# Patient Record
Sex: Female | Born: 1945 | Race: White | Hispanic: No | Marital: Married | Smoking: Never smoker
Health system: Southern US, Community
[De-identification: ages and names within clinical notes are randomized; demographics above are authoritative.]

## PROBLEM LIST (undated history)

## (undated) DIAGNOSIS — C801 Malignant (primary) neoplasm, unspecified: Secondary | ICD-10-CM

## (undated) DIAGNOSIS — A159 Respiratory tuberculosis unspecified: Secondary | ICD-10-CM

## (undated) DIAGNOSIS — J189 Pneumonia, unspecified organism: Secondary | ICD-10-CM

## (undated) DIAGNOSIS — F419 Anxiety disorder, unspecified: Secondary | ICD-10-CM

## (undated) DIAGNOSIS — K219 Gastro-esophageal reflux disease without esophagitis: Secondary | ICD-10-CM

## (undated) DIAGNOSIS — M1990 Unspecified osteoarthritis, unspecified site: Secondary | ICD-10-CM

## (undated) DIAGNOSIS — Z9889 Other specified postprocedural states: Secondary | ICD-10-CM

## (undated) DIAGNOSIS — G709 Myoneural disorder, unspecified: Secondary | ICD-10-CM

## (undated) DIAGNOSIS — T8859XA Other complications of anesthesia, initial encounter: Secondary | ICD-10-CM

## (undated) DIAGNOSIS — T4145XA Adverse effect of unspecified anesthetic, initial encounter: Secondary | ICD-10-CM

## (undated) DIAGNOSIS — I10 Essential (primary) hypertension: Secondary | ICD-10-CM

## (undated) DIAGNOSIS — M810 Age-related osteoporosis without current pathological fracture: Secondary | ICD-10-CM

## (undated) DIAGNOSIS — R112 Nausea with vomiting, unspecified: Secondary | ICD-10-CM

## (undated) HISTORY — PX: EYE SURGERY: SHX253

## (undated) HISTORY — PX: TONSILLECTOMY: SUR1361

## (undated) HISTORY — DX: Essential (primary) hypertension: I10

## (undated) HISTORY — PX: ABDOMINAL HYSTERECTOMY: SHX81

## (undated) HISTORY — PX: APPENDECTOMY: SHX54

## (undated) HISTORY — DX: Age-related osteoporosis without current pathological fracture: M81.0

## (undated) HISTORY — PX: FRACTURE SURGERY: SHX138

## (undated) HISTORY — DX: Respiratory tuberculosis unspecified: A15.9

## (undated) HISTORY — PX: JOINT REPLACEMENT: SHX530

---
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
## 1999-04-07 ENCOUNTER — Ambulatory Visit (HOSPITAL_BASED_OUTPATIENT_CLINIC_OR_DEPARTMENT_OTHER): Admission: RE | Admit: 1999-04-07 | Discharge: 1999-04-07 | Payer: Self-pay | Admitting: Orthopedic Surgery

## 1999-04-18 ENCOUNTER — Ambulatory Visit (HOSPITAL_BASED_OUTPATIENT_CLINIC_OR_DEPARTMENT_OTHER): Admission: RE | Admit: 1999-04-18 | Discharge: 1999-04-18 | Payer: Self-pay | Admitting: Orthopedic Surgery

## 2001-11-07 ENCOUNTER — Ambulatory Visit (HOSPITAL_BASED_OUTPATIENT_CLINIC_OR_DEPARTMENT_OTHER): Admission: RE | Admit: 2001-11-07 | Discharge: 2001-11-07 | Payer: Self-pay | Admitting: Surgery

## 2002-01-23 ENCOUNTER — Encounter: Admission: RE | Admit: 2002-01-23 | Discharge: 2002-01-23 | Payer: Self-pay | Admitting: Otolaryngology

## 2002-01-23 ENCOUNTER — Encounter: Payer: Self-pay | Admitting: Otolaryngology

## 2011-07-31 ENCOUNTER — Ambulatory Visit (INDEPENDENT_AMBULATORY_CARE_PROVIDER_SITE_OTHER): Payer: Medicare Other

## 2011-07-31 DIAGNOSIS — J019 Acute sinusitis, unspecified: Secondary | ICD-10-CM

## 2011-07-31 DIAGNOSIS — R05 Cough: Secondary | ICD-10-CM

## 2011-07-31 DIAGNOSIS — R059 Cough, unspecified: Secondary | ICD-10-CM

## 2011-07-31 DIAGNOSIS — J029 Acute pharyngitis, unspecified: Secondary | ICD-10-CM

## 2011-07-31 DIAGNOSIS — R0602 Shortness of breath: Secondary | ICD-10-CM

## 2011-08-20 ENCOUNTER — Ambulatory Visit (INDEPENDENT_AMBULATORY_CARE_PROVIDER_SITE_OTHER): Payer: Medicare Other

## 2011-08-20 DIAGNOSIS — R599 Enlarged lymph nodes, unspecified: Secondary | ICD-10-CM

## 2011-08-20 DIAGNOSIS — J019 Acute sinusitis, unspecified: Secondary | ICD-10-CM

## 2011-08-20 DIAGNOSIS — J029 Acute pharyngitis, unspecified: Secondary | ICD-10-CM

## 2011-10-27 ENCOUNTER — Ambulatory Visit (INDEPENDENT_AMBULATORY_CARE_PROVIDER_SITE_OTHER): Payer: Medicare Other | Admitting: Internal Medicine

## 2011-10-27 VITALS — BP 134/84 | HR 66 | Temp 97.8°F | Resp 16 | Ht 65.0 in | Wt 198.0 lb

## 2011-10-27 DIAGNOSIS — I10 Essential (primary) hypertension: Secondary | ICD-10-CM

## 2011-10-27 DIAGNOSIS — H9209 Otalgia, unspecified ear: Secondary | ICD-10-CM

## 2011-10-27 DIAGNOSIS — R51 Headache: Secondary | ICD-10-CM

## 2011-10-27 DIAGNOSIS — G6181 Chronic inflammatory demyelinating polyneuritis: Secondary | ICD-10-CM

## 2011-10-27 DIAGNOSIS — G518 Other disorders of facial nerve: Secondary | ICD-10-CM

## 2011-10-27 MED ORDER — VALACYCLOVIR HCL 1 G PO TABS: 1000.0000 mg | ORAL_TABLET | Freq: Three times a day (TID) | ORAL | Status: AC

## 2011-10-27 MED ORDER — LISINOPRIL-HYDROCHLOROTHIAZIDE 10-12.5 MG PO TABS: 1.0000 | ORAL_TABLET | Freq: Every day | ORAL | Status: DC

## 2011-10-27 NOTE — Patient Instructions (Signed)

## 2011-10-27 NOTE — Progress Notes (Signed)
  Subjective:    Patient ID: Kelli Swanson, female    DOB: 07-22-1946, 66 y.o.   MRN: [id number]  HPI Has pain left ear and face abrupt onset.  Review of Systems     Objective:   Physical Exam Normal    No rash seen.      Assessment & Plan:  Valtex 1g TID for possible shingles Motrin 600mg  prn pain Watch for rash

## 2012-02-27 ENCOUNTER — Ambulatory Visit: Payer: Medicare Other

## 2012-02-27 ENCOUNTER — Ambulatory Visit (INDEPENDENT_AMBULATORY_CARE_PROVIDER_SITE_OTHER): Payer: Medicare Other | Admitting: Family Medicine

## 2012-02-27 VITALS — BP 130/82 | HR 80 | Temp 98.7°F | Resp 16 | Ht 65.0 in | Wt 210.4 lb

## 2012-02-27 DIAGNOSIS — M7989 Other specified soft tissue disorders: Secondary | ICD-10-CM

## 2012-02-27 DIAGNOSIS — M25561 Pain in right knee: Secondary | ICD-10-CM

## 2012-02-27 DIAGNOSIS — M25569 Pain in unspecified knee: Secondary | ICD-10-CM

## 2012-02-27 DIAGNOSIS — M25559 Pain in unspecified hip: Secondary | ICD-10-CM

## 2012-02-27 MED ORDER — NABUMETONE 750 MG PO TABS: 750.0000 mg | ORAL_TABLET | Freq: Two times a day (BID) | ORAL | Status: AC
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
## 2012-02-27 NOTE — Patient Instructions (Signed)
Venous Doppler will be scheduled for the right leg.  Referral will be made to Dr. Wyline Mood.  Return if acutely worse.  Take Relafen twice daily.

## 2012-02-28 NOTE — Addendum Note (Signed)
Addended by: Carollee Leitz L on: 02/28/2012 10:50 AM   Modules accepted: Orders

## 2012-02-29 ENCOUNTER — Ambulatory Visit (HOSPITAL_COMMUNITY)
Admission: RE | Admit: 2012-02-29 | Discharge: 2012-02-29 | Disposition: A | Discharge status: Home / Self Care | Payer: Medicare Other | Source: Ambulatory Visit | Attending: Family Medicine | Admitting: Family Medicine

## 2012-02-29 DIAGNOSIS — M25561 Pain in right knee: Secondary | ICD-10-CM

## 2012-02-29 DIAGNOSIS — M79609 Pain in unspecified limb: Secondary | ICD-10-CM | POA: Insufficient documentation

## 2012-02-29 DIAGNOSIS — M7989 Other specified soft tissue disorders: Secondary | ICD-10-CM

## 2012-02-29 DIAGNOSIS — M25569 Pain in unspecified knee: Secondary | ICD-10-CM | POA: Insufficient documentation

## 2012-02-29 NOTE — Progress Notes (Signed)
*  Preliminary Results* Right lower extremity venous duplex completed. Right lower extremity is negative for deep vein thrombosis.  02/29/2012 10:40 AM Gertie Fey, RDMS, RDCS

## 2012-02-29 NOTE — Research (Signed)
[facility] clinical research study discussed with patient.  All questions and concerns were addressed and answered previous to obtaining informed consent.  No study procedures were initiated prior to obtaining informed consent.  A signed copy of the consent was provided to the patient.  Please contact study coordinator or Dr. Chancy Milroy for any questions or concerns.

## 2012-09-09 ENCOUNTER — Encounter: Payer: Self-pay | Admitting: Family Medicine

## 2012-11-05 HISTORY — PX: KNEE SURGERY: SHX244

## 2012-11-14 ENCOUNTER — Encounter: Payer: Self-pay | Admitting: Emergency Medicine

## 2012-12-17 ENCOUNTER — Ambulatory Visit (INDEPENDENT_AMBULATORY_CARE_PROVIDER_SITE_OTHER): Payer: Medicare Other | Admitting: Emergency Medicine

## 2012-12-17 VITALS — BP 154/84 | HR 75 | Temp 98.5°F | Resp 16 | Ht 64.5 in | Wt 224.0 lb

## 2012-12-17 DIAGNOSIS — G4700 Insomnia, unspecified: Secondary | ICD-10-CM

## 2012-12-17 DIAGNOSIS — I10 Essential (primary) hypertension: Secondary | ICD-10-CM

## 2012-12-17 DIAGNOSIS — E785 Hyperlipidemia, unspecified: Secondary | ICD-10-CM

## 2012-12-17 LAB — POCT CBC
Granulocyte percent: 67.1 %G (ref 37–80)
Hemoglobin: 13.9 g/dL (ref 12.2–16.2)
MCH, POC: 29.6 pg (ref 27–31.2)
MCV: 94.6 fL (ref 80–97)
MPV: 10.8 fL (ref 0–99.8)
RBC: 4.69 M/uL (ref 4.04–5.48)
WBC: 5.6 10*3/uL (ref 4.6–10.2)

## 2012-12-17 LAB — BASIC METABOLIC PANEL
BUN: 17 mg/dL (ref 6–23)
CO2: 28 mEq/L (ref 19–32)
Chloride: 103 mEq/L (ref 96–112)
Creat: 1.01 mg/dL (ref 0.50–1.10)
Potassium: 4.3 mEq/L (ref 3.5–5.3)

## 2012-12-17 LAB — LIPID PANEL
HDL: 68 mg/dL (ref >39)
LDL Cholesterol: 121 mg/dL — ABNORMAL HIGH (ref 0–99)
Triglycerides: 124 mg/dL (ref <150)
VLDL: 25 mg/dL (ref 0–40)

## 2012-12-17 MED ORDER — TRAMADOL HCL 50 MG PO TABS: 50.0000 mg | ORAL_TABLET | Freq: Four times a day (QID) | ORAL | Status: DC | PRN

## 2012-12-17 MED ORDER — LISINOPRIL-HYDROCHLOROTHIAZIDE 10-12.5 MG PO TABS: 2.0000 | ORAL_TABLET | Freq: Every day | ORAL | Status: DC

## 2012-12-17 NOTE — Progress Notes (Signed)
  Subjective:    Patient ID: Kelli Swanson, female    DOB: 1946-06-21, 67 y.o.   MRN: [id number]  HPI  67 year old female presents for a refill on bp med.  Has put on weight due to knee surgery.  She thinks the inabitility to move much is why her bp is up.  She has stated riding the stationary bike.  Ankle is swollen.  And knee is swollen.  Was on relafin but was taken off of it due to making her feel bad.  Does not sleep good due to pain.    Had knee surgery on March 26.  Dr Salvatore Marvel did the surgery.      Review of Systems     Objective:   Physical Exam HEENT exam is unremarkable neck supple chest clear heart regular rate no murmurs.  Results for orders placed in visit on 12/17/12  POCT CBC      Result Value Range   WBC 5.6  4.6 - 10.2 K/uL   Lymph, poc 1.3  0.6 - 3.4   POC LYMPH PERCENT 24.1  10 - 50 %L   MID (cbc) 0.5  0 - 0.9   POC MID % 8.8  0 - 12 %M   POC Granulocyte 3.8  2 - 6.9   Granulocyte percent 67.1  37 - 80 %G   RBC 4.69  4.04 - 5.48 M/uL   Hemoglobin 13.9  12.2 - 16.2 g/dL   HCT, POC 40.9  81.1 - 47.9 %   MCV 94.6  80 - 97 fL   MCH, POC 29.6  27 - 31.2 pg   MCHC 31.3 (*) 31.8 - 35.4 g/dL   RDW, POC 91.4     Platelet Count, POC 229  142 - 424 K/uL   MPV 10.8  0 - 99.8 fL        Assessment & Plan:  Blood pressure medicine was increased to 2 tablets a day to get better blood pressure control followup in 4-6 weeks.

## 2013-02-01 ENCOUNTER — Ambulatory Visit (INDEPENDENT_AMBULATORY_CARE_PROVIDER_SITE_OTHER): Payer: Medicare Other | Admitting: Emergency Medicine

## 2013-02-01 VITALS — BP 146/80 | HR 112 | Temp 98.1°F | Resp 18 | Ht 64.5 in | Wt 212.2 lb

## 2013-02-01 DIAGNOSIS — J018 Other acute sinusitis: Secondary | ICD-10-CM

## 2013-02-01 MED ORDER — PSEUDOEPHEDRINE-GUAIFENESIN ER 60-600 MG PO TB12: 1.0000 | ORAL_TABLET | Freq: Two times a day (BID) | ORAL | Status: DC

## 2013-02-01 MED ORDER — LEVOFLOXACIN 500 MG PO TABS: 500.0000 mg | ORAL_TABLET | Freq: Every day | ORAL | Status: DC

## 2013-02-01 NOTE — Patient Instructions (Addendum)

## 2013-02-01 NOTE — Progress Notes (Signed)
[facility] [address], [place] [address] [phone]  Date:  02/01/2013   Name:  Kelli Swanson   DOB:  Sep 17, 1945   MRN:  [id number]  PCP:  Tally Due, MD    Chief Complaint: Sinusitis and Headache   History of Present Illness:  Kelli Swanson is a 67 y.o. very pleasant female patient who presents with the following:  Ill for a week with nasal congestion and post nasal drainage. Has some chills and night sweats.  No documented fever or chills. No nausea or vomiting. No cough wheezing or shortness of breath.  Pressure in cheeks and forehead is now severe and interfering with sleep. Marked headache.  No neuro or visual symptoms.   No improvement with over the counter medications or other home remedies. Denies other complaint or health concern today.   Patient Active Problem List   Diagnosis Date Noted  . HTN (hypertension) 10/27/2011    Past Medical History  Diagnosis Date  . Hypertension   . Osteoporosis     Past Surgical History  Procedure Laterality Date  . Abdominal hysterectomy    . Appendectomy    . Fracture surgery    . Knee surgery Right 11/05/2012    History  Substance Use Topics  . Smoking status: Never Smoker   . Smokeless tobacco: Not on file  . Alcohol Use: Not on file    History reviewed. No pertinent family history.  Allergies  Allergen Reactions  . Penicillins Anaphylaxis  . Codeine Itching    Medication list has been reviewed and updated.  Current Outpatient Prescriptions on File Prior to Visit  Medication Sig Dispense Refill  . calcium-vitamin D (OSCAL WITH D) 500-200 MG-UNIT per tablet Take 1 tablet by mouth daily.      Marland Kitchen denosumab (PROLIA) 60 MG/ML SOLN injection Inject 60 mg into the skin every 6 (six) months. Administer in upper arm, thigh, or abdomen      . ibuprofen (ADVIL,MOTRIN) 200 MG tablet Take 200 mg by mouth every 6 (six) hours as needed for pain.      Marland Kitchen lisinopril-hydrochlorothiazide  (PRINZIDE,ZESTORETIC) 10-12.5 MG per tablet Take 2 tablets by mouth daily.  180 tablet  3  . Multiple Vitamins-Minerals (CENTRUM SILVER) tablet Take 1 tablet by mouth daily.      . traMADol (ULTRAM) 50 MG tablet Take 1 tablet (50 mg total) by mouth every 6 (six) hours as needed for pain.  30 tablet  1  . nabumetone (RELAFEN) 750 MG tablet Take 1 tablet (750 mg total) by mouth 2 (two) times daily.  30 tablet  1  . tobramycin-dexamethasone (TOBRADEX) ophthalmic solution 1 drop 3 (three) times daily.       No current facility-administered medications on file prior to visit.    Review of Systems:  As per HPI, otherwise negative.    Physical Examination: Filed Vitals:   02/01/13 1408  BP: 146/80  Pulse: 112  Temp: 98.1 F (36.7 C)  Resp: 18   Filed Vitals:   02/01/13 1408  Height: 5' 4.5" (1.638 m)  Weight: 212 lb 3.2 oz (96.253 kg)   Body mass index is 35.87 kg/(m^2). Ideal Body Weight: Weight in (lb) to have BMI = 25: 147.6  GEN: WDWN, moderate distress. Non-toxic, A & O x 3 HEENT: Atraumatic, Normocephalic. Neck supple. No masses, No LAD.  Tender over maxillary and frontal sinuses Ears and Nose: No external deformity. CV: RRR, No M/G/R. No JVD. No  thrill. No extra heart sounds. PULM: CTA B, no wheezes, crackles, rhonchi. No retractions. No resp. distress. No accessory muscle use. ABD: S, NT, ND, +BS. No rebound. No HSM. EXTR: No c/c/e NEURO Normal gait.  PSYCH: Normally interactive. Conversant. Not depressed or anxious appearing.  Calm demeanor.    Assessment and Plan: Sinusitis augmentin mucinex d   Signed,  Phillips Odor, MD

## 2013-02-05 ENCOUNTER — Telehealth: Payer: Self-pay

## 2013-02-05 NOTE — Telephone Encounter (Signed)
Patient called requesting prescription for the Levaquin be changed to something else due to leg pains. Please let pt know if this can be done  [phone] Pharmacy- [address]

## 2013-02-06 MED ORDER — DOXYCYCLINE HYCLATE 100 MG PO CAPS: 100.0000 mg | ORAL_CAPSULE | Freq: Two times a day (BID) | ORAL | Status: DC

## 2013-02-06 NOTE — Telephone Encounter (Signed)
Rx changed to doxycycline and was sent to pharmacy

## 2013-02-06 NOTE — Telephone Encounter (Signed)
Patient advised.

## 2013-03-09 ENCOUNTER — Encounter (HOSPITAL_COMMUNITY): Payer: Self-pay | Admitting: Pharmacy Technician

## 2013-03-11 ENCOUNTER — Other Ambulatory Visit: Payer: Self-pay | Admitting: Orthopedic Surgery

## 2013-03-12 NOTE — Pre-Procedure Instructions (Addendum)
Kelli Swanson  03/12/2013   Your procedure is scheduled on:  03/23/2013  Report to [facility] at 7:15 AM.  Call this number if you have problems the morning of surgery: [phone]   Remember:   Do not eat food or drink liquids after midnight. On Sunday   Take these medicines the morning of surgery with A SIP OF WATER: NONE   Do not wear jewelry, make-up or nail polish.  Do not wear lotions, powders, or perfumes. You may wear deodorant.  Do not shave 48 hours prior to surgery.   Do not bring valuables to the hospital.  [facility] is not responsible                   for any belongings or valuables.  Contacts, dentures or bridgework may not be worn into surgery.  Leave suitcase in the car. After surgery it may be brought to your room.  For patients admitted to the hospital, checkout time is 11:00 AM the day of  discharge.   Patients discharged the day of surgery will not be allowed to drive  home.  Name and phone number of your driver:   Special Instructions: Shower using CHG 2 nights before surgery and the night before surgery.  If you shower the day of surgery use CHG.  Use special wash - you have one bottle of CHG for all showers.  You should use approximately 1/3 of the bottle for each shower.   Please read over the following fact sheets that you were given: Pain Booklet, Coughing and Deep Breathing, Blood Transfusion Information, Total Joint Packet, MRSA Information and Surgical Site Infection Prevention

## 2013-03-13 ENCOUNTER — Ambulatory Visit (HOSPITAL_COMMUNITY)
Admission: RE | Admit: 2013-03-13 | Discharge: 2013-03-13 | Disposition: A | Discharge status: Home / Self Care | Payer: Medicare Other | Source: Ambulatory Visit | Attending: Orthopedic Surgery | Admitting: Orthopedic Surgery

## 2013-03-13 ENCOUNTER — Encounter (HOSPITAL_COMMUNITY): Payer: Self-pay

## 2013-03-13 ENCOUNTER — Encounter (HOSPITAL_COMMUNITY)
Admission: RE | Admit: 2013-03-13 | Discharge: 2013-03-13 | Disposition: A | Discharge status: Home / Self Care | Payer: Medicare Other | Source: Ambulatory Visit | Attending: Orthopedic Surgery | Admitting: Orthopedic Surgery

## 2013-03-13 DIAGNOSIS — Z01818 Encounter for other preprocedural examination: Secondary | ICD-10-CM | POA: Insufficient documentation

## 2013-03-13 DIAGNOSIS — I10 Essential (primary) hypertension: Secondary | ICD-10-CM | POA: Insufficient documentation

## 2013-03-13 DIAGNOSIS — Z01812 Encounter for preprocedural laboratory examination: Secondary | ICD-10-CM | POA: Insufficient documentation

## 2013-03-13 DIAGNOSIS — Z01810 Encounter for preprocedural cardiovascular examination: Secondary | ICD-10-CM | POA: Insufficient documentation

## 2013-03-13 DIAGNOSIS — M948X9 Other specified disorders of cartilage, unspecified sites: Secondary | ICD-10-CM | POA: Insufficient documentation

## 2013-03-13 HISTORY — DX: Gastro-esophageal reflux disease without esophagitis: K21.9

## 2013-03-13 HISTORY — DX: Other complications of anesthesia, initial encounter: T88.59XA

## 2013-03-13 HISTORY — DX: Other specified postprocedural states: Z98.890

## 2013-03-13 HISTORY — DX: Pneumonia, unspecified organism: J18.9

## 2013-03-13 HISTORY — DX: Malignant (primary) neoplasm, unspecified: C80.1

## 2013-03-13 HISTORY — DX: Unspecified osteoarthritis, unspecified site: M19.90

## 2013-03-13 HISTORY — DX: Myoneural disorder, unspecified: G70.9

## 2013-03-13 HISTORY — DX: Nausea with vomiting, unspecified: R11.2

## 2013-03-13 HISTORY — DX: Anxiety disorder, unspecified: F41.9

## 2013-03-13 HISTORY — DX: Adverse effect of unspecified anesthetic, initial encounter: T41.45XA

## 2013-03-13 LAB — URINALYSIS, ROUTINE W REFLEX MICROSCOPIC
Bilirubin Urine: NEGATIVE
Glucose, UA: NEGATIVE mg/dL
Hgb urine dipstick: NEGATIVE
Ketones, ur: NEGATIVE mg/dL
Leukocytes, UA: NEGATIVE
Nitrite: NEGATIVE
Protein, ur: NEGATIVE mg/dL
Specific Gravity, Urine: 1.016 (ref 1.005–1.030)
Urobilinogen, UA: 0.2 mg/dL (ref 0.0–1.0)
pH: 6 (ref 5.0–8.0)

## 2013-03-13 LAB — CBC WITH DIFFERENTIAL/PLATELET
Basophils Relative: 1 % (ref 0–1)
Eosinophils Absolute: 0.1 10*3/uL (ref 0.0–0.7)
HCT: 43.5 % (ref 36.0–46.0)
Hemoglobin: 14.8 g/dL (ref 12.0–15.0)
MCH: 31 pg (ref 26.0–34.0)
MCHC: 34 g/dL (ref 30.0–36.0)
Monocytes Absolute: 0.6 10*3/uL (ref 0.1–1.0)
Monocytes Relative: 8 % (ref 3–12)

## 2013-03-13 LAB — BASIC METABOLIC PANEL
BUN: 24 mg/dL — ABNORMAL HIGH (ref 6–23)
CO2: 27 mEq/L (ref 19–32)
Chloride: 101 mEq/L (ref 96–112)
Glucose, Bld: 107 mg/dL — ABNORMAL HIGH (ref 70–99)
Potassium: 3.9 mEq/L (ref 3.5–5.1)

## 2013-03-13 LAB — ABO/RH: ABO/RH(D): B NEG

## 2013-03-13 NOTE — Progress Notes (Signed)
Seen at [facility] , had EKG 1 + yrs. Ago, CXR done around the same time for URI
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
## 2013-03-22 DIAGNOSIS — M1711 Unilateral primary osteoarthritis, right knee: Secondary | ICD-10-CM | POA: Diagnosis present

## 2013-03-22 MED ORDER — VANCOMYCIN HCL IN DEXTROSE 1-5 GM/200ML-% IV SOLN
1000.0000 mg | INTRAVENOUS | Status: AC
  Administered 2013-03-23: 1000 mg via INTRAVENOUS
  Filled 2013-03-22: qty 200

## 2013-03-22 MED ORDER — CHLORHEXIDINE GLUCONATE 4 % EX LIQD: 60.0000 mL | Freq: Once | CUTANEOUS | Status: DC

## 2013-03-23 ENCOUNTER — Inpatient Hospital Stay (HOSPITAL_COMMUNITY)
Admission: RE | Admit: 2013-03-23 | Discharge: 2013-03-27 | DRG: 470 | Disposition: A | Discharge status: Home / Self Care | Payer: Medicare Other | Source: Ambulatory Visit | Attending: Orthopedic Surgery | Admitting: Orthopedic Surgery

## 2013-03-23 ENCOUNTER — Encounter (HOSPITAL_COMMUNITY): Payer: Self-pay | Admitting: Anesthesiology

## 2013-03-23 ENCOUNTER — Encounter (HOSPITAL_COMMUNITY)
Admission: RE | Disposition: A | Discharge status: Home / Self Care | Payer: Self-pay | Source: Ambulatory Visit | Attending: Orthopedic Surgery

## 2013-03-23 ENCOUNTER — Inpatient Hospital Stay (HOSPITAL_COMMUNITY): Payer: Medicare Other | Admitting: Anesthesiology

## 2013-03-23 DIAGNOSIS — D649 Anemia, unspecified: Secondary | ICD-10-CM | POA: Diagnosis not present

## 2013-03-23 DIAGNOSIS — I959 Hypotension, unspecified: Secondary | ICD-10-CM

## 2013-03-23 DIAGNOSIS — I9589 Other hypotension: Secondary | ICD-10-CM | POA: Diagnosis not present

## 2013-03-23 DIAGNOSIS — F411 Generalized anxiety disorder: Secondary | ICD-10-CM | POA: Diagnosis present

## 2013-03-23 DIAGNOSIS — M1710 Unilateral primary osteoarthritis, unspecified knee: Secondary | ICD-10-CM | POA: Diagnosis present

## 2013-03-23 DIAGNOSIS — E861 Hypovolemia: Secondary | ICD-10-CM | POA: Diagnosis not present

## 2013-03-23 DIAGNOSIS — N179 Acute kidney failure, unspecified: Secondary | ICD-10-CM

## 2013-03-23 DIAGNOSIS — Z85828 Personal history of other malignant neoplasm of skin: Secondary | ICD-10-CM

## 2013-03-23 DIAGNOSIS — M12569 Traumatic arthropathy, unspecified knee: Principal | ICD-10-CM | POA: Diagnosis present

## 2013-03-23 DIAGNOSIS — M1711 Unilateral primary osteoarthritis, right knee: Secondary | ICD-10-CM

## 2013-03-23 DIAGNOSIS — I10 Essential (primary) hypertension: Secondary | ICD-10-CM | POA: Diagnosis present

## 2013-03-23 DIAGNOSIS — M810 Age-related osteoporosis without current pathological fracture: Secondary | ICD-10-CM | POA: Diagnosis present

## 2013-03-23 DIAGNOSIS — K219 Gastro-esophageal reflux disease without esophagitis: Secondary | ICD-10-CM | POA: Diagnosis present

## 2013-03-23 DIAGNOSIS — IMO0002 Reserved for concepts with insufficient information to code with codable children: Secondary | ICD-10-CM | POA: Diagnosis present

## 2013-03-23 DIAGNOSIS — F40298 Other specified phobia: Secondary | ICD-10-CM | POA: Diagnosis present

## 2013-03-23 HISTORY — PX: TOTAL KNEE ARTHROPLASTY: SHX125

## 2013-03-23 HISTORY — PX: TOTAL KNEE ARTHROPLASTY WITH HARDWARE REMOVAL: SHX6437

## 2013-03-23 SURGERY — REVISION, TOTAL ARTHROPLASTY, KNEE, WITH ARTICULAR COMPONENT REPLACEMENT
Anesthesia: General | Site: Knee | Laterality: Right | Wound class: Clean

## 2013-03-23 MED ORDER — SODIUM CHLORIDE 0.9 % IV SOLN
1000.0000 mg | INTRAVENOUS | Status: AC
  Administered 2013-03-23: 1000 mg via INTRAVENOUS
  Filled 2013-03-23: qty 10

## 2013-03-23 MED ORDER — ONDANSETRON HCL 4 MG/2ML IJ SOLN
INTRAMUSCULAR | Status: DC | PRN
  Administered 2013-03-23: 4 mg via INTRAVENOUS

## 2013-03-23 MED ORDER — NEOSTIGMINE METHYLSULFATE 1 MG/ML IJ SOLN
INTRAMUSCULAR | Status: DC | PRN
  Administered 2013-03-23: 4 mg via INTRAVENOUS

## 2013-03-23 MED ORDER — MENTHOL 3 MG MT LOZG: 1.0000 | LOZENGE | OROMUCOSAL | Status: DC | PRN

## 2013-03-23 MED ORDER — FENTANYL CITRATE 0.05 MG/ML IJ SOLN
INTRAMUSCULAR | Status: DC | PRN
  Administered 2013-03-23: 50 ug via INTRAVENOUS
  Administered 2013-03-23: 100 ug via INTRAVENOUS
  Administered 2013-03-23: 50 ug via INTRAVENOUS
  Administered 2013-03-23: 100 ug via INTRAVENOUS

## 2013-03-23 MED ORDER — ONDANSETRON HCL 4 MG PO TABS: 4.0000 mg | ORAL_TABLET | Freq: Four times a day (QID) | ORAL | Status: DC | PRN

## 2013-03-23 MED ORDER — CEFUROXIME SODIUM 1.5 G IJ SOLR
INTRAMUSCULAR | Status: DC | PRN
  Administered 2013-03-23: 1.5 g

## 2013-03-23 MED ORDER — PHENYLEPHRINE HCL 10 MG/ML IJ SOLN
INTRAMUSCULAR | Status: DC | PRN
  Administered 2013-03-23 (×2): 80 ug via INTRAVENOUS

## 2013-03-23 MED ORDER — FENTANYL CITRATE 0.05 MG/ML IJ SOLN
25.0000 ug | INTRAMUSCULAR | Status: DC | PRN
Start: 2013-03-23 — End: 2013-03-23
  Administered 2013-03-23 (×2): 50 ug via INTRAVENOUS

## 2013-03-23 MED ORDER — ACETAMINOPHEN 325 MG PO TABS
650.0000 mg | ORAL_TABLET | Freq: Four times a day (QID) | ORAL | Status: DC | PRN
  Administered 2013-03-23: 650 mg via ORAL
  Filled 2013-03-23: qty 2

## 2013-03-23 MED ORDER — LIDOCAINE HCL (CARDIAC) 20 MG/ML IV SOLN
INTRAVENOUS | Status: DC | PRN
  Administered 2013-03-23: 50 mg via INTRAVENOUS

## 2013-03-23 MED ORDER — 0.9 % SODIUM CHLORIDE (POUR BTL) OPTIME
TOPICAL | Status: DC | PRN
  Administered 2013-03-23: 1000 mL

## 2013-03-23 MED ORDER — METOCLOPRAMIDE HCL 5 MG/ML IJ SOLN: 5.0000 mg | Freq: Three times a day (TID) | INTRAMUSCULAR | Status: DC | PRN

## 2013-03-23 MED ORDER — LISINOPRIL-HYDROCHLOROTHIAZIDE 10-12.5 MG PO TABS: 2.0000 | ORAL_TABLET | Freq: Every day | ORAL | Status: DC

## 2013-03-23 MED ORDER — SENNOSIDES-DOCUSATE SODIUM 8.6-50 MG PO TABS
1.0000 | ORAL_TABLET | Freq: Every evening | ORAL | Status: DC | PRN
  Administered 2013-03-26: 1 via ORAL
  Filled 2013-03-23: qty 1

## 2013-03-23 MED ORDER — DEXTROSE-NACL 5-0.45 % IV SOLN: INTRAVENOUS | Status: DC

## 2013-03-23 MED ORDER — LACTATED RINGERS IV SOLN
INTRAVENOUS | Status: DC | PRN
  Administered 2013-03-23 (×2): via INTRAVENOUS

## 2013-03-23 MED ORDER — KCL IN DEXTROSE-NACL 20-5-0.45 MEQ/L-%-% IV SOLN
INTRAVENOUS | Status: AC
  Filled 2013-03-23: qty 1000

## 2013-03-23 MED ORDER — BUPIVACAINE LIPOSOME 1.3 % IJ SUSP
INTRAMUSCULAR | Status: DC | PRN
  Administered 2013-03-23: 20 mL

## 2013-03-23 MED ORDER — MAGNESIUM CITRATE PO SOLN
1.0000 | Freq: Once | ORAL | Status: AC | PRN
  Filled 2013-03-23: qty 296

## 2013-03-23 MED ORDER — ARTIFICIAL TEARS OP OINT
TOPICAL_OINTMENT | OPHTHALMIC | Status: DC | PRN
  Administered 2013-03-23: 1 via OPHTHALMIC

## 2013-03-23 MED ORDER — ACETAMINOPHEN 325 MG PO TABS
ORAL_TABLET | ORAL | Status: AC
  Administered 2013-03-23: 650 mg via ORAL
  Filled 2013-03-23: qty 2

## 2013-03-23 MED ORDER — MORPHINE SULFATE 10 MG/ML IJ SOLN
INTRAMUSCULAR | Status: DC | PRN
  Administered 2013-03-23: 3 mg via INTRAVENOUS
  Administered 2013-03-23: 2 mg via INTRAVENOUS
  Administered 2013-03-23: 3 mg via INTRAVENOUS
  Administered 2013-03-23: 2 mg via INTRAVENOUS

## 2013-03-23 MED ORDER — OXYCODONE HCL 5 MG/5ML PO SOLN
5.0000 mg | Freq: Once | ORAL | Status: AC | PRN
Start: 2013-03-23 — End: 2013-03-23

## 2013-03-23 MED ORDER — PROPOFOL 10 MG/ML IV BOLUS
INTRAVENOUS | Status: DC | PRN
  Administered 2013-03-23: 150 mg via INTRAVENOUS

## 2013-03-23 MED ORDER — FENTANYL CITRATE 0.05 MG/ML IJ SOLN
INTRAMUSCULAR | Status: AC
  Administered 2013-03-23: 50 ug via INTRAVENOUS
  Filled 2013-03-23: qty 2

## 2013-03-23 MED ORDER — ROCURONIUM BROMIDE 100 MG/10ML IV SOLN
INTRAVENOUS | Status: DC | PRN
  Administered 2013-03-23: 50 mg via INTRAVENOUS

## 2013-03-23 MED ORDER — ACETAMINOPHEN 650 MG RE SUPP: 650.0000 mg | Freq: Four times a day (QID) | RECTAL | Status: DC | PRN

## 2013-03-23 MED ORDER — DIPHENHYDRAMINE HCL 25 MG PO TABS: 25.0000 mg | ORAL_TABLET | Freq: Four times a day (QID) | ORAL | Status: DC | PRN

## 2013-03-23 MED ORDER — SODIUM CHLORIDE 0.9 % IR SOLN
Status: DC | PRN
  Administered 2013-03-23: 3000 mL

## 2013-03-23 MED ORDER — SODIUM CHLORIDE 0.9 % IJ SOLN
INTRAMUSCULAR | Status: DC | PRN
  Administered 2013-03-23: 60 mL

## 2013-03-23 MED ORDER — LISINOPRIL 20 MG PO TABS
20.0000 mg | ORAL_TABLET | Freq: Every day | ORAL | Status: DC
  Administered 2013-03-24: 20 mg via ORAL
  Filled 2013-03-23 (×3): qty 1

## 2013-03-23 MED ORDER — KCL IN DEXTROSE-NACL 20-5-0.45 MEQ/L-%-% IV SOLN
INTRAVENOUS | Status: DC
  Administered 2013-03-24: 16:00:00 via INTRAVENOUS
  Filled 2013-03-23 (×14): qty 1000

## 2013-03-23 MED ORDER — TIZANIDINE HCL 2 MG PO TABS
2.0000 mg | ORAL_TABLET | Freq: Three times a day (TID) | ORAL | Status: DC | PRN
  Administered 2013-03-23 – 2013-03-25 (×2): 2 mg via ORAL
  Filled 2013-03-23 (×2): qty 1

## 2013-03-23 MED ORDER — OXYCODONE HCL 5 MG PO TABS: 5.0000 mg | ORAL_TABLET | Freq: Once | ORAL | Status: AC | PRN

## 2013-03-23 MED ORDER — HYDROCHLOROTHIAZIDE 25 MG PO TABS
25.0000 mg | ORAL_TABLET | Freq: Every day | ORAL | Status: DC
  Administered 2013-03-24: 25 mg via ORAL
  Filled 2013-03-23 (×3): qty 1

## 2013-03-23 MED ORDER — DIPHENHYDRAMINE HCL 12.5 MG/5ML PO ELIX
12.5000 mg | ORAL_SOLUTION | ORAL | Status: DC | PRN
  Administered 2013-03-23: 25 mg via ORAL
  Filled 2013-03-23: qty 10

## 2013-03-23 MED ORDER — ONDANSETRON HCL 4 MG/2ML IJ SOLN: 4.0000 mg | Freq: Four times a day (QID) | INTRAMUSCULAR | Status: DC | PRN

## 2013-03-23 MED ORDER — OXYCODONE HCL 5 MG PO TABS
5.0000 mg | ORAL_TABLET | ORAL | Status: DC | PRN
  Administered 2013-03-23 – 2013-03-27 (×12): 10 mg via ORAL
  Filled 2013-03-23 (×13): qty 2

## 2013-03-23 MED ORDER — ASPIRIN EC 325 MG PO TBEC
325.0000 mg | DELAYED_RELEASE_TABLET | Freq: Every day | ORAL | Status: DC
  Administered 2013-03-24 – 2013-03-27 (×4): 325 mg via ORAL
  Filled 2013-03-23 (×6): qty 1

## 2013-03-23 MED ORDER — BISACODYL 5 MG PO TBEC: 5.0000 mg | DELAYED_RELEASE_TABLET | Freq: Every day | ORAL | Status: DC | PRN

## 2013-03-23 MED ORDER — DOCUSATE SODIUM 100 MG PO CAPS
100.0000 mg | ORAL_CAPSULE | Freq: Two times a day (BID) | ORAL | Status: DC
  Administered 2013-03-23 – 2013-03-27 (×8): 100 mg via ORAL
  Filled 2013-03-23 (×9): qty 1

## 2013-03-23 MED ORDER — BUPIVACAINE LIPOSOME 1.3 % IJ SUSP
20.0000 mL | Freq: Once | INTRAMUSCULAR | Status: DC
  Filled 2013-03-23: qty 20

## 2013-03-23 MED ORDER — PHENOL 1.4 % MT LIQD: 1.0000 | OROMUCOSAL | Status: DC | PRN

## 2013-03-23 MED ORDER — MIDAZOLAM HCL 5 MG/5ML IJ SOLN
INTRAMUSCULAR | Status: DC | PRN
  Administered 2013-03-23: 1 mg via INTRAVENOUS

## 2013-03-23 MED ORDER — LACTATED RINGERS IV SOLN
Freq: Once | INTRAVENOUS | Status: AC
  Administered 2013-03-23: 08:00:00 via INTRAVENOUS

## 2013-03-23 MED ORDER — CEFUROXIME SODIUM 1.5 G IJ SOLR
INTRAMUSCULAR | Status: AC
  Filled 2013-03-23: qty 1.5

## 2013-03-23 MED ORDER — GLYCOPYRROLATE 0.2 MG/ML IJ SOLN
INTRAMUSCULAR | Status: DC | PRN
  Administered 2013-03-23: .7 mg via INTRAVENOUS

## 2013-03-23 MED ORDER — SCOPOLAMINE 1 MG/3DAYS TD PT72
MEDICATED_PATCH | TRANSDERMAL | Status: AC
  Administered 2013-03-23: 1 via TRANSDERMAL
  Filled 2013-03-23: qty 1

## 2013-03-23 MED ORDER — HYDROMORPHONE HCL PF 1 MG/ML IJ SOLN
0.5000 mg | INTRAMUSCULAR | Status: DC | PRN
  Administered 2013-03-23 – 2013-03-24 (×4): 0.5 mg via INTRAVENOUS
  Filled 2013-03-23 (×4): qty 1

## 2013-03-23 MED ORDER — OXYCODONE HCL 5 MG PO TABS
ORAL_TABLET | ORAL | Status: AC
  Administered 2013-03-23: 5 mg via ORAL
  Filled 2013-03-23: qty 1

## 2013-03-23 MED ORDER — METOCLOPRAMIDE HCL 10 MG PO TABS: 5.0000 mg | ORAL_TABLET | Freq: Three times a day (TID) | ORAL | Status: DC | PRN

## 2013-03-23 MED ORDER — ONDANSETRON HCL 4 MG/2ML IJ SOLN: 4.0000 mg | Freq: Once | INTRAMUSCULAR | Status: DC | PRN

## 2013-03-23 MED ORDER — FAMOTIDINE 20 MG PO TABS
20.0000 mg | ORAL_TABLET | Freq: Every day | ORAL | Status: DC
  Administered 2013-03-24 – 2013-03-27 (×3): 20 mg via ORAL
  Filled 2013-03-23 (×5): qty 1

## 2013-03-23 MED ORDER — ALUM & MAG HYDROXIDE-SIMETH 200-200-20 MG/5ML PO SUSP: 30.0000 mL | ORAL | Status: DC | PRN

## 2013-03-23 SURGICAL SUPPLY — 60 items
BANDAGE ESMARK 6X9 LF (GAUZE/BANDAGES/DRESSINGS) ×1 IMPLANT
BIT DRILL JC END 3.2X130 (BIT) ×1 IMPLANT
BLADE SAG 18X100X1.27 (BLADE) ×2 IMPLANT
BLADE SAW SGTL 13X75X1.27 (BLADE) ×2 IMPLANT
BLADE SURG ROTATE 9660 (MISCELLANEOUS) IMPLANT
BNDG CMPR 9X6 STRL LF SNTH (GAUZE/BANDAGES/DRESSINGS) ×1
BNDG CMPR MED 10X6 ELC LF (GAUZE/BANDAGES/DRESSINGS) ×1
BNDG ELASTIC 6X10 VLCR STRL LF (GAUZE/BANDAGES/DRESSINGS) ×2 IMPLANT
BNDG ESMARK 6X9 LF (GAUZE/BANDAGES/DRESSINGS) ×2
BOWL SMART MIX CTS (DISPOSABLE) ×2 IMPLANT
CAPT RP KNEE ×1 IMPLANT
CEMENT HV SMART SET (Cement) ×4 IMPLANT
CLOTH BEACON ORANGE TIMEOUT ST (SAFETY) ×2 IMPLANT
COVER SURGICAL LIGHT HANDLE (MISCELLANEOUS) ×2 IMPLANT
CUFF TOURNIQUET SINGLE 34IN LL (TOURNIQUET CUFF) ×1 IMPLANT
CUFF TOURNIQUET SINGLE 44IN (TOURNIQUET CUFF) IMPLANT
DRAPE EXTREMITY T 121X128X90 (DRAPE) ×2 IMPLANT
DRAPE U-SHAPE 47X51 STRL (DRAPES) ×2 IMPLANT
DRSG PAD ABDOMINAL 8X10 ST (GAUZE/BANDAGES/DRESSINGS) ×3 IMPLANT
DURAPREP 26ML APPLICATOR (WOUND CARE) ×2 IMPLANT
ELECT REM PT RETURN 9FT ADLT (ELECTROSURGICAL) ×2
ELECTRODE REM PT RTRN 9FT ADLT (ELECTROSURGICAL) ×1 IMPLANT
EVACUATOR 1/8 PVC DRAIN (DRAIN) ×2 IMPLANT
GAUZE XEROFORM 1X8 LF (GAUZE/BANDAGES/DRESSINGS) ×3 IMPLANT
GLOVE BIO SURGEON STRL SZ7.5 (GLOVE) ×2 IMPLANT
GLOVE BIO SURGEON STRL SZ8.5 (GLOVE) ×3 IMPLANT
GLOVE BIOGEL PI IND STRL 8 (GLOVE) ×2 IMPLANT
GLOVE BIOGEL PI IND STRL 9 (GLOVE) ×1 IMPLANT
GLOVE BIOGEL PI INDICATOR 8 (GLOVE) ×1
GLOVE BIOGEL PI INDICATOR 9 (GLOVE) ×1
GOWN PREVENTION PLUS XLARGE (GOWN DISPOSABLE) ×2 IMPLANT
GOWN STRL NON-REIN LRG LVL3 (GOWN DISPOSABLE) ×2 IMPLANT
GOWN STRL REIN XL XLG (GOWN DISPOSABLE) ×4 IMPLANT
HANDPIECE INTERPULSE COAX TIP (DISPOSABLE) ×2
HOOD PEEL AWAY FACE SHEILD DIS (HOOD) ×6 IMPLANT
KIT BASIN OR (CUSTOM PROCEDURE TRAY) ×2 IMPLANT
KIT ROOM TURNOVER OR (KITS) ×2 IMPLANT
MANIFOLD NEPTUNE II (INSTRUMENTS) ×2 IMPLANT
NS IRRIG 1000ML POUR BTL (IV SOLUTION) ×2 IMPLANT
PACK TOTAL JOINT (CUSTOM PROCEDURE TRAY) ×2 IMPLANT
PAD ARMBOARD 7.5X6 YLW CONV (MISCELLANEOUS) ×4 IMPLANT
PADDING CAST ABS 6INX4YD NS (CAST SUPPLIES) ×1
PADDING CAST ABS COTTON 6X4 NS (CAST SUPPLIES) IMPLANT
PADDING CAST COTTON 6X4 STRL (CAST SUPPLIES) ×2 IMPLANT
SCREW CORTEX ST 4.5X44 (Screw) ×1 IMPLANT
SET HNDPC FAN SPRY TIP SCT (DISPOSABLE) ×1 IMPLANT
SPONGE GAUZE 4X4 12PLY (GAUZE/BANDAGES/DRESSINGS) ×2 IMPLANT
SPONGE LAP 18X18 X RAY DECT (DISPOSABLE) ×2 IMPLANT
STAPLER VISISTAT 35W (STAPLE) ×2 IMPLANT
SUCTION FRAZIER TIP 10 FR DISP (SUCTIONS) ×2 IMPLANT
SUT VIC AB 0 CTX 36 (SUTURE) ×2
SUT VIC AB 0 CTX36XBRD ANTBCTR (SUTURE) ×1 IMPLANT
SUT VIC AB 1 CTX 36 (SUTURE) ×2
SUT VIC AB 1 CTX36XBRD ANBCTR (SUTURE) ×1 IMPLANT
SUT VIC AB 2-0 CT1 27 (SUTURE) ×2
SUT VIC AB 2-0 CT1 TAPERPNT 27 (SUTURE) ×1 IMPLANT
TOWEL OR 17X24 6PK STRL BLUE (TOWEL DISPOSABLE) ×2 IMPLANT
TOWEL OR 17X26 10 PK STRL BLUE (TOWEL DISPOSABLE) ×2 IMPLANT
TRAY FOLEY CATH 16FRSI W/METER (SET/KITS/TRAYS/PACK) ×1 IMPLANT
WATER STERILE IRR 1000ML POUR (IV SOLUTION) ×5 IMPLANT

## 2013-03-23 NOTE — Anesthesia Procedure Notes (Signed)
Procedure Name: Intubation Date/Time: 03/23/2013 9:31 AM Performed by: Carmela Rima Pre-anesthesia Checklist: Patient identified, Timeout performed, Emergency Drugs available, Suction available and Patient being monitored Patient Re-evaluated:Patient Re-evaluated prior to inductionOxygen Delivery Method: Circle system utilized Preoxygenation: Pre-oxygenation with 100% oxygen Intubation Type: IV induction Laryngoscope Size: Mac and 3 Grade View: Grade I Tube type: Oral Number of attempts: 1 Placement Confirmation: positive ETCO2,  ETT inserted through vocal cords under direct vision and breath sounds checked- equal and bilateral Secured at: 23 cm Tube secured with: Tape Dental Injury: Teeth and Oropharynx as per pre-operative assessment

## 2013-03-23 NOTE — Anesthesia Preprocedure Evaluation (Addendum)
Anesthesia Evaluation  Patient identified by MRN, date of birth, ID band Patient awake    Reviewed: Allergy & Precautions, H&P , NPO status , Patient's Chart, lab work & pertinent test results  History of Anesthesia Complications (+) PONV  Airway Mallampati: I TM Distance: >3 FB Neck ROM: full    Dental  (+) Teeth Intact and Dental Advidsory Given   Pulmonary  breath sounds clear to auscultation        Cardiovascular hypertension, On Medications Rhythm:Regular Rate:Normal     Neuro/Psych  Neuromuscular disease    GI/Hepatic GERD-  Medicated and Controlled,  Endo/Other    Renal/GU      Musculoskeletal   Abdominal   Peds  Hematology   Anesthesia Other Findings   Reproductive/Obstetrics                          Anesthesia Physical Anesthesia Plan  ASA: II  Anesthesia Plan: General   Post-op Pain Management:    Induction: Intravenous  Airway Management Planned: LMA  Additional Equipment:   Intra-op Plan:   Post-operative Plan: Extubation in OR  Informed Consent: I have reviewed the patients History and Physical, chart, labs and discussed the procedure including the risks, benefits and alternatives for the proposed anesthesia with the patient or authorized representative who has indicated his/her understanding and acceptance.   Dental Advisory Given and Dental advisory given  Plan Discussed with: Anesthesiologist, Surgeon and CRNA  Anesthesia Plan Comments:        Anesthesia Quick Evaluation

## 2013-03-23 NOTE — Interval H&P Note (Signed)
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 After consideration of risks, benefits and other options for treatment, the patient has consented to  Procedure(s): TOTAL KNEE ARTHROPLASTY WITH HARDWARE REMOVAL (Right) as a surgical intervention .  The patient's history has been reviewed, patient examined, no change in status, stable for surgery.  I have reviewed the patient's chart and labs.  Questions were answered to the patient's satisfaction.     Nestor Lewandowsky

## 2013-03-23 NOTE — Progress Notes (Signed)
Orthopedic Tech Progress Note Patient Details:  Kelli Swanson July 16, 1946 [id number] Applied CPM to RLE.  Applied OHF with trapeze to bed. CPM Right Knee CPM Right Knee: On Right Knee Flexion (Degrees): 60 Right Knee Extension (Degrees): 0   [name] 03/23/2013, 1:24 PM

## 2013-03-23 NOTE — Transfer of Care (Signed)
Immediate Anesthesia Transfer of Care Note  Patient: Kelli Swanson  Procedure(s) Performed: Procedure(s): TOTAL KNEE ARTHROPLASTY WITH HARDWARE REMOVAL (Right)  Patient Location: PACU  Anesthesia Type:General  Level of Consciousness: awake, alert  and oriented  Airway & Oxygen Therapy: Patient connected to face mask oxygen  Post-op Assessment: Report given to PACU RN and Patient moving all extremities X 4  Post vital signs: Reviewed and stable  Complications: No apparent anesthesia complications

## 2013-03-23 NOTE — Op Note (Signed)
PATIENT ID:      Kelli Swanson  MRN:     [id number] DOB/AGE:    [birth year] / 67 y.o.       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   Weight as of 02/01/13: 96.253 kg (212 lb 3.2 oz).                                                        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 Removal of 3 screws from the dynamic condylar sideplate and distal femur     SURGEON: WUJWJ,XBJYN J    ASSISTANT:   Tomi Likens. 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 Patient has failed all conservative measures including anti-inflammatory medicines, narcotics, attempts at  exercise and weight loss, cortisone injections and viscosupplementation.  Risks and benefits of surgery have been discussed, questions answered.   DESCRIPTION OF PROCEDURE: The patient identified by armband, received  IV antibiotics, in the holding area at [facility]. Patient taken to the operating room, appropriate anesthetic  monitors were attached, and general endotracheal anesthesia  induced with  the patient in supine position, Foley catheter was inserted. Tourniquet  applied high to the operative thigh. Lateral post and foot positioner  applied to the table, the lower extremity was then prepped and draped  in usual sterile fashion from the ankle to the tourniquet. Time-out procedure was performed. The limb was wrapped with an Esmarch bandage and the tourniquet inflated to 350 mmHg. We began the operation by making the anterior midline incision starting at handbreadth above the patella going over the patella 1 cm medial to and  4 cm distal to the tibial tubercle. Small bleeders in the skin and the  subcutaneous tissue identified and cauterized. Transverse retinaculum was incised and reflected medially and a medial parapatellar arthrotomy was accomplished. the patella was everted and theprepatellar fat pad resected. The superficial medial collateral  ligament was then elevated from anterior to posterior along the proximal  flare of the  tibia and anterior half of the menisci resected. The knee was hyperflexed exposing bone on bone arthritis. Peripheral and notch osteophytes as well as the cruciate ligaments were then resected. We continued to  work our way around posteriorly along the proximal tibia, and externally  rotated the tibia subluxing it out from underneath the femur. A McHale  retractor was placed through the notch and a lateral Hohmann retractor  placed, and we then drilled through the proximal tibia in line with the  axis of the tibia followed by an intramedullary guide rod and 2-degree  posterior slope cutting guide. The tibial cutting guide was pinned into place  allowing resection of 10 mm of bone medially and about 6 mm of bone  laterally because of her valgus deformity. We then directed our attention to the distal femur and removed the anterior aspect of the trochlea creating a flat line across the distal femur appear. An osteotomy guide was then screwed to the  anterior femur set at 90 and a lateral plane and 7 of valgus in the AP plane. This allowed [place] to resect the distal femur removing 10 mm of bone medially and a 3-4 mm of bone laterally. Using the posterior sizing guide we then sized for a #4NR femoral component and pinned the guide in 0 degrees of external rotation.The chamfer cutting guide was pinned into place. The anterior, posterior, and chamfer cuts were accomplished without difficulty followed by  the Sigma RP box cutting guide and the box cut. We also removed posterior osteophytes from the posterior femoral condyles. At this  time, the knee was brought into full extension. We checked our  extension and flexion gaps and found them symmetric at 10mm.  The patella thickness measured at 23 mm. We set the cutting guide at 14 and removed the posterior 9 mm  of the patella, sized for a 32 button and drilled the lollipop. The knee  was then once again hyperflexed exposing the proximal tibia. We sized for a #3 tibial base plate, applied the smokestack and the conical reamer followed by the the Delta fin keel punch. We then hammered into place the Sigma RP trial femoral component, inserted a 10-mm trial bearing, trial patellar button, and took the knee through range of motion from 0-130 degrees. No thumb pressure was required for patellar  tracking. At this point, all trial components were removed, a double batch of DePuy HV cement with 1500 mg of Zinacef was mixed and applied to all bony metallic mating surfaces except for the posterior condyles of the femur itself. In order, we  hammered into place the tibial tray and removed excess cement, the femoral component and removed excess cement, a 10-mm Sigma RP bearing  was inserted, and the knee brought to full extension with compression.  The patellar button was clamped into place, and excess cement  removed. While the cement cured the wound was irrigated out with normal saline solution pulse lavage, and  medium Hemovac drains were placed from an anterolateral  approach. Ligament stability and patellar tracking were checked and found to be excellent. The parapatellar arthrotomy was closed with  running #1 Vicryl suture. The subcutaneous tissue with 0 and 2-0 undyed  Vicryl suture, and the skin with skin staples. A dressing of Xeroform,  4 x 4, dressing sponges, Webril, and Ace wrap applied. The patient  awakened, extubated, and taken to recovery room without difficulty.   Roscoe Witts J 03/23/2013, 11:47 AM

## 2013-03-23 NOTE — Anesthesia Postprocedure Evaluation (Signed)
  Anesthesia Post-op Note  Patient: Kelli Swanson  Procedure(s) Performed: Procedure(s): TOTAL KNEE ARTHROPLASTY WITH HARDWARE REMOVAL (Right)  Patient Location: PACU  Anesthesia Type:General  Level of Consciousness: awake, alert  and oriented  Airway and Oxygen Therapy: Patient Spontanous Breathing and Patient connected to nasal cannula oxygen  Post-op Pain: mild  Post-op Assessment: Post-op Vital signs reviewed  Post-op Vital Signs: Reviewed  Complications: No apparent anesthesia complications

## 2013-03-24 ENCOUNTER — Encounter (HOSPITAL_COMMUNITY): Payer: Self-pay | Admitting: Orthopedic Surgery

## 2013-03-24 LAB — CBC
MCH: 31.1 pg (ref 26.0–34.0)
MCV: 90.9 fL (ref 78.0–100.0)
Platelets: 196 10*3/uL (ref 150–400)
RBC: 3.51 MIL/uL — ABNORMAL LOW (ref 3.87–5.11)

## 2013-03-24 NOTE — Evaluation (Signed)
Physical Therapy Evaluation Patient Details Name: Kelli Swanson MRN: [id number] DOB: Sep 22, 1945 Today's Date: 03/24/2013 Time: 4098-1191 PT Time Calculation (min): 29 min  PT Assessment / Plan / Recommendation History of Present Illness  s/p elective Rt TKA   Clinical Impression  Pt is s/p Rt TKA POD#1 resulting in the deficits listed below (see PT Problem List).  Pt highly motivated to return home with husband. Limited amb this morning due to "faint" feeling. Pt will benefit from skilled PT to increase their independence and safety with mobility to allow discharge to the venue listed below.      PT Assessment  Patient needs continued PT services    Follow Up Recommendations  Home health PT;Supervision/Assistance - 24 hour    Does the patient have the potential to tolerate intense rehabilitation      Barriers to Discharge   none    Equipment Recommendations  Rolling walker with 5" wheels    Recommendations for Other Services OT consult   Frequency 7X/week    Precautions / Restrictions Precautions Precautions: Fall;Knee Precaution Booklet Issued: Yes (comment) Precaution Comments: pt given TKA HEP handout  Restrictions Weight Bearing Restrictions: Yes RLE Weight Bearing: Weight bearing as tolerated   Pertinent Vitals/Pain 3/10; pt premedicate. "i have a high pain tolerance"       Mobility  Bed Mobility Bed Mobility: Supine to Sit;Sitting - Scoot to Edge of Bed Supine to Sit: 4: Min guard;HOB elevated;With rails Sitting - Scoot to Edge of Bed: 5: Supervision;With rail Details for Bed Mobility Assistance: min guard to initate advancement of Rt LE; with vc's pt was able to advance Rt LE with UEs; required HOB elevated and handrails; increased time due to pain  Transfers Transfers: Sit to Stand;Stand to Sit Sit to Stand: 4: Min assist;From bed;From elevated surface Stand to Sit: 4: Min assist;To chair/3-in-1;With armrests;With upper extremity assist Details for  Transfer Assistance: pt performed sit to stand x 2; initally with crutches (per pt preference); pt required min (A) to achieve upright standing position and maintain balance; pt unsafe with crutches at this time; RW increases stability; vc's for hand placement and sequencing  Ambulation/Gait Ambulation/Gait Assistance: 4: Min guard Ambulation Distance (Feet): 4 Feet Assistive device: Rolling walker Ambulation/Gait Assistance Details: pt anxious due to history of "fainting"; pt demo increased stability with RW; vc's for gt sequencing and management of RW Gait Pattern: Step-to pattern;Decreased stance time - right;Decreased step length - left Gait velocity: decreased due to pain Stairs: No Wheelchair Mobility Wheelchair Mobility: No    Exercises Total Joint Exercises Ankle Circles/Pumps: AROM;10 reps;Supine;Both Quad Sets: AROM;Right;10 reps;Seated Long Arc Quad: AROM;Right;10 reps;Seated Knee Flexion: AROM;Right;5 reps;Seated Goniometric ROM: see LE assessment    PT Diagnosis: Difficulty walking;Acute pain  PT Problem List: Decreased strength;Decreased range of motion;Decreased balance;Decreased mobility;Decreased knowledge of use of DME;Pain PT Treatment Interventions: DME instruction;Gait training;Functional mobility training;Therapeutic activities;Therapeutic exercise;Balance training;Neuromuscular re-education;Patient/family education     PT Goals(Current goals can be found in the care plan section) Acute Rehab PT Goals Patient Stated Goal: to go home with husband  PT Goal Formulation: With patient Time For Goal Achievement: 03/31/13 Potential to Achieve Goals: Good  Visit Information  Last PT Received On: 03/24/13 Assistance Needed: +1 History of Present Illness: s/p elective Rt TKA        Prior Functioning  Home Living Family/patient expects to be discharged to:: Private residence Living Arrangements: Spouse/significant other Available Help at Discharge: Available 24  hours/day;Family;Friend(s) Type of Home: House Home Access:  Ramped entrance Home Layout: Able to live on main level with bedroom/bathroom Home Equipment: Crutches;Bedside commode Prior Function Level of Independence: Independent Communication Communication: No difficulties Dominant Hand: Left    Cognition  Cognition Arousal/Alertness: Awake/alert Behavior During Therapy: WFL for tasks assessed/performed Overall Cognitive Status: Within Functional Limits for tasks assessed    Extremity/Trunk Assessment Upper Extremity Assessment Upper Extremity Assessment: Defer to OT evaluation Lower Extremity Assessment Lower Extremity Assessment: RLE deficits/detail RLE Deficits / Details: knee AROM flexion 60 degrees in sitting; limited by ACE wrap and pain  RLE Sensation:  (WFL) Cervical / Trunk Assessment Cervical / Trunk Assessment: Kyphotic   Balance Balance Balance Assessed: Yes Static Standing Balance Static Standing - Balance Support: Bilateral upper extremity supported;During functional activity Static Standing - Level of Assistance: 4: Min assist Static Standing - Comment/# of Minutes: pt required (A) to donn shorts; min (A) to maintain balance; tolerated static standing ~3 min; became fatigued and returned to EOB to rest   End of Session PT - End of Session Equipment Utilized During Treatment: Gait belt Activity Tolerance: Patient tolerated treatment well Patient left: in chair;with call bell/phone within reach;with family/visitor present Nurse Communication: Mobility status CPM Right Knee CPM Right Knee: [address]  GP     Donnamarie Poag [place], [place] [phone] 03/24/2013, 8:53 AM

## 2013-03-24 NOTE — Progress Notes (Signed)
Orthopedic Tech Progress Note Patient Details:  Kelli Swanson 1945/11/01 [id number]  Patient ID: [name], female   DOB: [birth year], 67 y.o.   MRN: [id number] Put pt on cpm @ 45 DEGREES FOR COMFORT;RN NOTIFIED @1515   Nikki Dom 03/24/2013, 3:22 PM

## 2013-03-24 NOTE — Progress Notes (Signed)
Patient ID: Kelli Swanson, female   DOB: 05/10/1946, 67 y.o.   MRN: [id number] PATIENT ID: Kelli Swanson  MRN: [id number]  DOB/AGE:  04-Apr-1946 / 67 y.o.  1 Day Post-Op Procedure(s) (LRB): TOTAL KNEE ARTHROPLASTY WITH HARDWARE REMOVAL (Right)    PROGRESS NOTE Subjective: Patient is alert, oriented, no Nausea, no Vomiting, no passing gas, no Bowel Movement. Taking PO well. Denies SOB, Chest or Calf Pain. Using Incentive Spirometer, PAS in place. Ambulate WBAT, CPM 0-60 Patient reports pain as 4 on 0-10 scale  .    Objective: Vital signs in last 24 hours: Filed Vitals:   03/23/13 1400 03/23/13 2120 03/24/13 0143 03/24/13 0519  BP: 153/66 138/46 153/57 138/98  Pulse: 65 88 75 90  Temp: 97.3 F (36.3 C) 97.7 F (36.5 C) 98.8 F (37.1 C) 98.5 F (36.9 C)  TempSrc: Oral  Oral Oral  Resp: 16 16 18 18   SpO2: 100% 100% 100% 100%      Intake/Output from previous day: I/O last 3 completed shifts: In: 3480 [P.O.:480; I.V.:3000] Out: 2775 [Urine:2200; Drains:375; Blood:200]   Intake/Output this shift:     LABORATORY DATA:  Recent Labs  03/24/13 0615  WBC 11.2*  HGB 10.9*  HCT 31.9*  PLT 196    Examination: Neurologically intact ABD soft Neurovascular intact Sensation intact distally Intact pulses distally Dorsiflexion/Plantar flexion intact Incision: no drainage No cellulitis present Compartment soft} Blood and plasma separated in drain indicating minimal recent drainage, drain pulled without difficulty. Foley out, no pain with actually loading the limb at 50 pounds. Assessment:   1 Day Post-Op Procedure(s) (LRB): TOTAL KNEE ARTHROPLASTY WITH HARDWARE REMOVAL (Right) ADDITIONAL DIAGNOSIS:    Plan: PT/OT WBAT, CPM 5/hrs day until ROM 0-90 degrees, then D/C CPM DVT Prophylaxis:  SCDx72hrs, ASA 325 mg BID x 2 weeks DISCHARGE PLAN: Home DISCHARGE NEEDS: HHPT, HHRN, CPM, Walker and 3-in-1 comode seat     [name] 03/24/2013, 7:13 AM

## 2013-03-24 NOTE — Progress Notes (Signed)
Orthopedic Tech Progress Note Patient Details:  Kelli Swanson 1946-02-01 [id number] On cpm at 8:20 pm RLE 0-45 Patient ID: [name], female   DOB: 12-04-1945, 67 y.o.   MRN: [id number]   Kelli Swanson 03/24/2013, 8:20 PM

## 2013-03-24 NOTE — Progress Notes (Signed)
Physical Therapy Treatment Patient Details Name: Kelli Swanson MRN: [id number] DOB: 29-Jun-1946 Today's Date: 03/24/2013 Time: 1005-1030 PT Time Calculation (min): 25 min  PT Assessment / Plan / Recommendation  History of Present Illness s/p elective Rt TKA    PT Comments   Pt progressing with therapy. Able to increase amb distance and theraex. Continues to have difficulty advancing Rt LE onto bed. Pt highly motivated to return home with husband. Will cont to f/u with pt to maximize functional mobility.   Follow Up Recommendations  Home health PT;Supervision/Assistance - 24 hour     Does the patient have the potential to tolerate intense rehabilitation     Barriers to Discharge   none    Equipment Recommendations  Rolling walker with 5" wheels    Recommendations for Other Services OT consult  Frequency 7X/week   Progress towards PT Goals Progress towards PT goals: Progressing toward goals  Plan Current plan remains appropriate    Precautions / Restrictions Precautions Precautions: Fall;Knee Precaution Booklet Issued: Yes (comment) Precaution Comments: reiterated no pillow under knee Restrictions Weight Bearing Restrictions: Yes RLE Weight Bearing: Weight bearing as tolerated   Pertinent Vitals/Pain 5/10 at rest ; pt premedicated     Mobility  Bed Mobility Bed Mobility: Sit to Supine Supine to Sit: 4: Min guard;HOB elevated;With rails Sitting - Scoot to Edge of Bed: 5: Supervision;With rail Sit to Supine: HOB elevated;With rail;4: Min assist Details for Bed Mobility Assistance: min (A) to advance Rt LE onto bed; pt unable to perform hooklying technique; pt requires vc's for hand placement; relies on handrails Transfers Transfers: Sit to Stand;Stand to Sit Sit to Stand: 4: Min assist;From chair/3-in-1;With armrests;With upper extremity assist Stand to Sit: 4: Min guard;To bed;With upper extremity assist Details for Transfer Assistance: pt required (A) to achieve  upright standing position from chair; has difficulty standing from lower surfaces but with rocking ant/post technique can shift weight anteriorly to perform transfer easier; vc's for hand placement and safety; pt begins to sit without reaching back for surface  Ambulation/Gait Ambulation/Gait Assistance: 4: Min guard Ambulation Distance (Feet): 20 Feet Assistive device: Rolling walker Ambulation/Gait Assistance Details: mod vc's for gt sequencing and upright posture; pt has difficulty WB through Rt LE; vc's to increase heel strike and foot flat; min guard for safety and cues for RW management  Gait Pattern: Step-to pattern;Decreased stance time - right;Decreased step length - left Gait velocity: decreased due to pain Stairs: No Wheelchair Mobility Wheelchair Mobility: No    Exercises Total Joint Exercises Ankle Circles/Pumps: AROM;Both;10 reps;Seated Quad Sets: AROM;Right;10 reps;Supine Short Arc Quad: AAROM;Right;10 reps;Supine Heel Slides: AAROM;Right;5 reps;Seated Hip ABduction/ADduction: Right;10 reps;Seated;AAROM Long Arc Quad: AROM;Right;10 reps;Seated Knee Flexion: AROM;Right;5 reps;Seated Goniometric ROM: see LE assessment    PT Diagnosis: Difficulty walking;Acute pain  PT Problem List: Decreased strength;Decreased range of motion;Decreased balance;Decreased mobility;Decreased knowledge of use of DME;Pain PT Treatment Interventions: DME instruction;Gait training;Functional mobility training;Therapeutic activities;Therapeutic exercise;Balance training;Neuromuscular re-education;Patient/family education   PT Goals (current goals can now be found in the care plan section) Acute Rehab PT Goals Patient Stated Goal: to go home with husband  PT Goal Formulation: With patient Time For Goal Achievement: 03/31/13 Potential to Achieve Goals: Good  Visit Information  Last PT Received On: 03/24/13 Assistance Needed: +1 History of Present Illness: s/p elective Rt TKA     Subjective  Data  Subjective: "im hurting a little. it may be because ive been sitting here" agreable to walk and wishes to return to bed  Patient Stated Goal: to go home with husband    Cognition  Cognition Arousal/Alertness: Awake/alert Behavior During Therapy: WFL for tasks assessed/performed Overall Cognitive Status: Within Functional Limits for tasks assessed    Balance  Balance Balance Assessed: No Static Standing Balance Static Standing - Balance Support: Bilateral upper extremity supported;During functional activity Static Standing - Level of Assistance: 4: Min assist Static Standing - Comment/# of Minutes: pt required (A) to donn shorts; min (A) to maintain balance; tolerated static standing ~3 min; became fatigued and returned to EOB to rest   End of Session PT - End of Session Equipment Utilized During Treatment: Gait belt Activity Tolerance: Patient tolerated treatment well Patient left: in bed;with call bell/phone within reach Nurse Communication: Mobility status CPM Right Knee CPM Right Knee: [address]   GP     Donnamarie Poag [place], [place] [phone] 03/24/2013, 10:43 AM

## 2013-03-24 NOTE — Progress Notes (Signed)
SW received a consult for possible placement. PT  At this time is recommending home with HH and not SNF. CM aware. Clinical Social Worker will sign off for now as social work intervention is no longer needed. Please consult us again if new need arises.   Loyalty Arentz, MSW [phone] 

## 2013-03-24 NOTE — Plan of Care (Signed)
Problem: Acute Rehab PT Goals Goal: Pt Will Ambulate Pt has personal goal to be using crutches vs RW. Pt states RW hurts her back.

## 2013-03-25 LAB — CBC
HCT: 28.1 % — ABNORMAL LOW (ref 36.0–46.0)
MCHC: 34.5 g/dL (ref 30.0–36.0)
MCV: 90.4 fL (ref 78.0–100.0)
RDW: 14.4 % (ref 11.5–15.5)
WBC: 14.4 10*3/uL — ABNORMAL HIGH (ref 4.0–10.5)

## 2013-03-25 LAB — BASIC METABOLIC PANEL
Calcium: 7.1 mg/dL — ABNORMAL LOW (ref 8.4–10.5)
Chloride: 95 mEq/L — ABNORMAL LOW (ref 96–112)
Creatinine, Ser: 3.16 mg/dL — ABNORMAL HIGH (ref 0.50–1.10)
GFR calc Af Amer: 16 mL/min — ABNORMAL LOW (ref >90)
GFR calc non Af Amer: 14 mL/min — ABNORMAL LOW (ref >90)

## 2013-03-25 LAB — CBC WITH DIFFERENTIAL/PLATELET
Basophils Absolute: 0 10*3/uL (ref 0.0–0.1)
Basophils Relative: 0 % (ref 0–1)
Eosinophils Absolute: 0.1 10*3/uL (ref 0.0–0.7)
Eosinophils Relative: 1 % (ref 0–5)
Lymphocytes Relative: 8 % — ABNORMAL LOW (ref 12–46)
MCHC: 34.3 g/dL (ref 30.0–36.0)
MCV: 90.1 fL (ref 78.0–100.0)
Platelets: 161 10*3/uL (ref 150–400)
RDW: 14.7 % (ref 11.5–15.5)
WBC: 16.7 10*3/uL — ABNORMAL HIGH (ref 4.0–10.5)

## 2013-03-25 LAB — TROPONIN I: Troponin I: <0.3 ng/mL (ref <0.30)

## 2013-03-25 LAB — LACTIC ACID, PLASMA: Lactic Acid, Venous: 1.1 mmol/L (ref 0.5–2.2)

## 2013-03-25 MED ORDER — OXYCODONE-ACETAMINOPHEN 5-325 MG PO TABS: 1.0000 | ORAL_TABLET | ORAL | Status: DC | PRN

## 2013-03-25 MED ORDER — SODIUM CHLORIDE 0.9 % IV BOLUS (SEPSIS)
500.0000 mL | Freq: Once | INTRAVENOUS | Status: AC
  Administered 2013-03-25: 500 mL via INTRAVENOUS

## 2013-03-25 MED ORDER — SODIUM CHLORIDE 0.9 % IV BOLUS (SEPSIS)
1000.0000 mL | Freq: Once | INTRAVENOUS | Status: AC
  Administered 2013-03-25: 1000 mL via INTRAVENOUS

## 2013-03-25 MED ORDER — SODIUM CHLORIDE 0.9 % IV SOLN
INTRAVENOUS | Status: AC
  Administered 2013-03-25: via INTRAVENOUS

## 2013-03-25 MED ORDER — ASPIRIN EC 325 MG PO TBEC: 325.0000 mg | DELAYED_RELEASE_TABLET | Freq: Two times a day (BID) | ORAL | Status: DC

## 2013-03-25 MED ORDER — TIZANIDINE HCL 2 MG PO CAPS: 2.0000 mg | ORAL_CAPSULE | Freq: Three times a day (TID) | ORAL | Status: DC

## 2013-03-25 NOTE — Progress Notes (Signed)
PATIENT ID: Kelli Swanson  MRN: [id number]  DOB/AGE:  1945-12-17 / 67 y.o.  2 Days Post-Op Procedure(s) (LRB): TOTAL KNEE ARTHROPLASTY WITH HARDWARE REMOVAL (Right)    PROGRESS NOTE Subjective: Patient is alert, oriented, no Nausea, no Vomiting, yes passing gas, no Bowel Movement. Taking PO well. Denies SOB, Chest or Calf Pain. Using Incentive Spirometer, PAS in place. Ambulate wbat, pt walked 20 ft in hall, CPM 0-45 degrees. Patient reports pain as moderate  .    Objective: Vital signs in last 24 hours: Filed Vitals:   03/24/13 1430 03/24/13 1551 03/24/13 2012 03/25/13 0521  BP: 124/70 124/74 131/46 96/34  Pulse: 74  80 78  Temp: 97.5 F (36.4 C)  99.1 F (37.3 C) 98.3 F (36.8 C)  TempSrc:   Oral Oral  Resp: 18  18 18   SpO2: 96%  97% 98%      Intake/Output from previous day: I/O last 3 completed shifts: In: 2580 [P.O.:1080; I.V.:1500] Out: 1625 [Urine:1500; Drains:125]   Intake/Output this shift:     LABORATORY DATA:  Recent Labs  03/24/13 0615 03/25/13 0500  WBC 11.2* PENDING  HGB 10.9* 9.7*  HCT 31.9* 28.1*  PLT 196 190    Examination: Neurologically intact ABD soft Neurovascular intact Sensation intact distally Intact pulses distally Dorsiflexion/Plantar flexion intact Incision: no drainage No cellulitis present Compartment soft}  Assessment:   2 Days Post-Op Procedure(s) (LRB): TOTAL KNEE ARTHROPLASTY WITH HARDWARE REMOVAL (Right) ADDITIONAL DIAGNOSIS:  Hypertension  Plan: PT/OT WBAT, CPM 5/hrs day until ROM 0-90 degrees, then D/C CPM DVT Prophylaxis:  SCDx72hrs, ASA 325 mg BID x 2 weeks DISCHARGE PLAN: Home when patient passes PT. DISCHARGE NEEDS: HHPT, HHRN, CPM, Walker and 3-in-1 comode seat     Belynda Pagaduan R 03/25/2013, 7:42 AM

## 2013-03-25 NOTE — Progress Notes (Signed)
Labs reviewed. Hemoglobin stable around 9.5, no indication for acute transfusion at this time. Creatinine has bumped to 3.15 with hyponatremia to 125.  I suspect this is due to volume depletion. Lactic acid is normal.  Troponin is negative.  Will give another 1L NS bolus as I suspect she is intravascularly depleted and run NS at 150cc/hr afterwards.  Recheck BMP and CBC in the morning.  May end up requiring transfusion if develops dilution anemia.  Swanson, Kelli Appleman 03/25/2013, 10:08 PM
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
## 2013-03-25 NOTE — Progress Notes (Signed)
Occupational Therapy Evaluation Patient Details Name: Kelli Swanson MRN: [id number] DOB: Jan 13, 1946 Today's Date: 03/25/2013 Time: 4098-1191 OT Time Calculation (min): 16 min  OT Assessment / Plan / Recommendation History of present illness s/p elective Rt TKA    Clinical Impression   Patient presents to OT with decreased ADL independence s/p R TKA. Will benefit from OT to maximize independence and safety with ADLs prior to d/c home with her husband.    OT Assessment  Patient needs continued OT Services    Follow Up Recommendations  Supervision/Assistance - 24 hour;No OT follow up    Barriers to Discharge      Equipment Recommendations  Other (comment) (possibly tub DME (tbd))    Recommendations for Other Services    Frequency  Min 2X/week    Precautions / Restrictions Precautions Precautions: Fall;Knee Precaution Comments: reiterated no pillow under knee Restrictions Weight Bearing Restrictions: Yes RLE Weight Bearing: Weight bearing as tolerated   Pertinent Vitals/Pain     ADL  Eating/Feeding: Performed;Independent Where Assessed - Eating/Feeding: Chair Grooming: Performed;Independent Where Assessed - Grooming: Supported sitting Lower Body Dressing: Performed;Moderate assistance Where Assessed - Lower Body Dressing: Supported sitting Toilet Transfer: Simulated;Minimal assistance Toilet Transfer Method: Sit to stand Toilet Transfer Equipment: Raised toilet seat with arms (or 3-in-1 over toilet) Toileting - Clothing Manipulation and Hygiene: Performed;Min guard Where Assessed - Toileting Clothing Manipulation and Hygiene: Sit to stand from 3-in-1 or toilet Transfers/Ambulation Related to ADLs: Patient moving around min A-min guard A within her room with RW. ADL Comments: Patient states her husband will A with LB bathing/dressing at home. Patient owns a [employer]. She also reports toileting with nursing staff many times and feels good about that. Next session to  focus on tub/shower transfer.    OT Diagnosis: Acute pain;Generalized weakness  OT Problem List: Pain;Decreased knowledge of use of DME or AE OT Treatment Interventions: Self-care/ADL training;DME and/or AE instruction;Therapeutic activities;Patient/family education   OT Goals(Current goals can be found in the care plan section) Acute Rehab OT Goals Patient Stated Goal: to go home with husband  OT Goal Formulation: With patient Time For Goal Achievement: 04/08/13 Potential to Achieve Goals: Good  Visit Information  Last OT Received On: 03/25/13 Assistance Needed: +1 History of Present Illness: s/p elective Rt TKA        Prior Functioning     Home Living Family/patient expects to be discharged to:: Private residence Living Arrangements: Spouse/significant other Available Help at Discharge: Available 24 hours/day;Family;Friend(s) Type of Home: House Home Access: Ramped entrance Home Layout: Able to live on main level with bedroom/bathroom Home Equipment: Crutches;Bedside commode Prior Function Level of Independence: Independent Communication Communication: No difficulties Dominant Hand: Left         Vision/Perception Vision - History Baseline Vision: No visual deficits Patient Visual Report: No change from baseline   Cognition  Cognition Arousal/Alertness: Awake/alert Behavior During Therapy: WFL for tasks assessed/performed Overall Cognitive Status: Within Functional Limits for tasks assessed    Extremity/Trunk Assessment Upper Extremity Assessment Upper Extremity Assessment: Overall WFL for tasks assessed Lower Extremity Assessment Lower Extremity Assessment: Defer to PT evaluation     Mobility Bed Mobility Bed Mobility: Supine to Sit Supine to Sit: 4: Min guard;HOB flat Details for Bed Mobility Assistance: educated pt on how to use sheet to hook around ball of foot and use bil UEs to (A) Rt LE to/off EOB; pt required vc's and increased time to complete  task but no physical (A) needed; just setup (A) required with  cues Transfers Sit to Stand: 4: Min assist;From bed;With upper extremity assist Stand to Sit: 4: Min assist;To toilet;With armrests Details for Transfer Assistance: pt required (A) and two attempts to achieve upright standing position; vc's for hand placement and safety with RW     Exercise Total Joint Exercises Ankle Circles/Pumps: AROM;Both;10 reps;Seated Quad Sets: AROM;Right;10 reps;Seated Heel Slides: AAROM;Right;5 reps;Other (comment);Seated (using sheet to be independent ) Hip ABduction/ADduction: AROM;Right;10 reps;Seated Long Arc Quad: AROM;Right;10 reps;Seated Knee Flexion: AAROM;Right;10 reps;Seated Goniometric ROM: knee flexion AAROM 50 degrees limited by pain   Balance Balance Balance Assessed: No   End of Session OT - End of Session Equipment Utilized During Treatment: Rolling walker Activity Tolerance: Patient tolerated treatment well Patient left: in chair;with call bell/phone within reach Nurse Communication: Patient requests pain meds  GO     Jibri Schriefer A 03/25/2013, 10:16 AM
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
## 2013-03-25 NOTE — Progress Notes (Signed)
03/25/13 Patient set up with [facility], PT and OT by MD office. Spoke with patient, no change in d/c plan. She lives with husband and has rolling walker, 3N1 and CPM at home per [employer]. Anticiapte d/ 03/26/13. Jacquelynn Cree RN, BSN, CCM

## 2013-03-25 NOTE — Progress Notes (Signed)
Physical Therapy Treatment Patient Details Name: Kelli Swanson MRN: [id number] DOB: 29-May-1946 Today's Date: 03/25/2013 Time: 4098-1191 PT Time Calculation (min): 25 min  PT Assessment / Plan / Recommendation  History of Present Illness s/p elective Rt TKA    PT Comments   Pt slowly progressing with therapy. Pt continues to require (A) with sit to stand and amb. In today's session, pt was educated on using long sheet or towel to use bil UEs and advance Rt LE to make pt more independent. Pt is not at the level she needs to be to D/C home today mobility wise. Will continue to f/u with pt to maximize functional mobility.   Follow Up Recommendations  Home health PT;Supervision/Assistance - 24 hour     Does the patient have the potential to tolerate intense rehabilitation     Barriers to Discharge        Equipment Recommendations  Other (comment) (3 in 1 and RW have been delievered )    Recommendations for Other Services    Frequency 7X/week   Progress towards PT Goals Progress towards PT goals: Progressing toward goals  Plan Current plan remains appropriate    Precautions / Restrictions Precautions Precautions: Fall;Knee Precaution Comments: reiterated no pillow under knee Restrictions Weight Bearing Restrictions: Yes RLE Weight Bearing: Weight bearing as tolerated   Pertinent Vitals/Pain "not really in pain anymore just sore" Premedicated     Mobility  Bed Mobility Bed Mobility: Supine to Sit Supine to Sit: 4: Min guard;HOB flat Details for Bed Mobility Assistance: educated pt on how to use sheet to hook around ball of foot and use bil UEs to (A) Rt LE to/off EOB; pt required vc's and increased time to complete task but no physical (A) needed; just setup (A) required with cues Transfers Transfers: Sit to Stand;Stand to Sit Sit to Stand: 4: Min assist;From bed;With upper extremity assist Stand to Sit: 4: Min assist;To toilet;With armrests Details for Transfer Assistance:  pt required (A) and two attempts to achieve upright standing position; vc's for hand placement and safety with RW Ambulation/Gait Ambulation/Gait Assistance: 4: Min guard Ambulation Distance (Feet): 60 Feet Assistive device: Rolling walker Ambulation/Gait Assistance Details: vc's for gt sequencing; enouraged pt to increase foot flat and heel strike on Rt LE; initally pt amb on toes; pt relies heavily on UEs and UEs become fatigued requiring 2 standing rest breaks; multimodal cues for upright posture and to look foward with amb and not at feet; min guard for safety and cues for safety  Gait Pattern: Step-to pattern;Decreased stance time - right;Decreased step length - left Gait velocity: decreased Stairs: No Wheelchair Mobility Wheelchair Mobility: No    Exercises Total Joint Exercises Ankle Circles/Pumps: AROM;Both;10 reps;Seated Quad Sets: AROM;Right;10 reps;Seated Heel Slides: AAROM;Right;5 reps;Other (comment);Seated (using sheet to be independent ) Hip ABduction/ADduction: AROM;Right;10 reps;Seated Long Arc Quad: AROM;Right;10 reps;Seated Knee Flexion: AAROM;Right;10 reps;Seated Goniometric ROM: knee flexion AAROM 50 degrees limited by pain   PT Diagnosis:    PT Problem List:   PT Treatment Interventions:     PT Goals (current goals can now be found in the care plan section) Acute Rehab PT Goals Patient Stated Goal: to go home with husband  PT Goal Formulation: With patient Time For Goal Achievement: 03/31/13 Potential to Achieve Goals: Good  Visit Information  Last PT Received On: 03/25/13 Assistance Needed: +1 History of Present Illness: s/p elective Rt TKA     Subjective Data  Subjective: "i have been up 3 or 4 times  since i saw you. i told the doctor im just not ready to go home today"  Patient Stated Goal: to go home with husband    Cognition  Cognition Arousal/Alertness: Awake/alert Behavior During Therapy: WFL for tasks assessed/performed Overall Cognitive  Status: Within Functional Limits for tasks assessed    Balance  Balance Balance Assessed: No  End of Session PT - End of Session Equipment Utilized During Treatment: Gait belt Activity Tolerance: Patient tolerated treatment well Patient left: in chair;with call bell/phone within reach Nurse Communication: Mobility status   GP     Kelli Swanson, Kelli Swanson [phone] 03/25/2013, 9:17 AM

## 2013-03-25 NOTE — Progress Notes (Signed)
Physical Therapy Treatment Patient Details Name: Kelli Swanson MRN: [id number] DOB: 03/05/1946 Today's Date: 03/25/2013 Time: 1330-1400 PT Time Calculation (min): 30 min  PT Assessment / Plan / Recommendation  History of Present Illness s/p elective Rt TKA    PT Comments   Pt limited in therapy today due to fatigue and feeling "light headed". Pt has had low BP throughout the day; was asymptomatic sitting. Becomes "swimmy headed" and fatigued quickly with amb. BP in sitting after amb 82/38. Pt hopes to D/C tomorrow. Will need to increase mobility and address bed mobility prior to D/C.    Follow Up Recommendations  Home health PT;Supervision/Assistance - 24 hour     Does the patient have the potential to tolerate intense rehabilitation     Barriers to Discharge        Equipment Recommendations  None recommended by PT    Recommendations for Other Services    Frequency 7X/week   Progress towards PT Goals Progress towards PT goals: Progressing toward goals  Plan Current plan remains appropriate    Precautions / Restrictions Precautions Precautions: Fall;Knee Restrictions Weight Bearing Restrictions: Yes RLE Weight Bearing: Weight bearing as tolerated   Pertinent Vitals/Pain "just tired. Not hurting" BP82/38     Mobility  Bed Mobility Bed Mobility: Sit to Supine Sit to Supine: 4: Min assist;HOB flat;With rail Details for Bed Mobility Assistance: (A) to advance Rt LE onto bed; pt unable to (A) with UEs due to feeling lightheaded; pt required vc's for sequencing and safety  Transfers Transfers: Sit to Stand;Stand to Sit Sit to Stand: 4: Min assist;From chair/3-in-1;With upper extremity assist Stand to Sit: 4: Min assist;To bed;With upper extremity assist Details for Transfer Assistance: (A) to achieve upright standing position from chair and 3 in 1; pt unsteady this afternoon and demo LOB posteriorly; recovered with (A); pt required vc's for hand placement and safety   Ambulation/Gait Ambulation/Gait Assistance: 4: Min assist Ambulation Distance (Feet): 10 Feet (x2; to bathroom and returned to bed) Assistive device: Rolling walker Ambulation/Gait Assistance Details: vc's for gt sequencing; pt unsteady with gt today; c/o feeling lightheaded and weak; unable to increase amb distance due to decreased BP and symptoms of hypotension; pt continues to amb on toes; encouraged to increase WB on Rt LE; pt amb with step to gt; pt fatigued quickly today; max cues for upright posture; pt with continuous flexed trunk  Gait Pattern: Step-to pattern;Decreased stance time - right;Decreased step length - left Gait velocity: decreased Stairs: No Wheelchair Mobility Wheelchair Mobility: No    Exercises Total Joint Exercises Ankle Circles/Pumps: AROM;Both;10 reps;Seated Heel Slides: AAROM;Right;10 reps;Seated Straight Leg Raises: AAROM;Right;5 reps;Seated Long Arc Quad: AROM;Right;10 reps;Seated   PT Diagnosis:    PT Problem List:   PT Treatment Interventions:     PT Goals (current goals can now be found in the care plan section) Acute Rehab PT Goals Patient Stated Goal: to go home with husband  PT Goal Formulation: With patient Time For Goal Achievement: 03/31/13 Potential to Achieve Goals: Good  Visit Information  Last PT Received On: 03/25/13 Assistance Needed: +1 History of Present Illness: s/p elective Rt TKA     Subjective Data  Subjective: "i need to go to the bathroom and then we can work"  Patient Stated Goal: to go home with husband    Cognition  Cognition Arousal/Alertness: Awake/alert Behavior During Therapy: WFL for tasks assessed/performed Overall Cognitive Status: Within Functional Limits for tasks assessed    Balance  Balance Balance Assessed: Yes  Static Standing Balance Static Standing - Balance Support: During functional activity (pt leaning on sink to perform ADLs) Static Standing - Level of Assistance: 4: Min assist Static Standing  - Comment/# of Minutes: pt unsteady washing hands at sink; min (A) to maintain balance  End of Session PT - End of Session Equipment Utilized During Treatment: Gait belt Activity Tolerance: Other (comment);Patient limited by fatigue (low BP and symptomatic ) Patient left: in bed;with call bell/phone within reach Nurse Communication: Mobility status;Other (comment) (BP )   GP     [name] [place], [place] [phone] 03/25/2013, 2:12 PM

## 2013-03-26 DIAGNOSIS — I959 Hypotension, unspecified: Secondary | ICD-10-CM

## 2013-03-26 DIAGNOSIS — M1710 Unilateral primary osteoarthritis, unspecified knee: Secondary | ICD-10-CM

## 2013-03-26 DIAGNOSIS — N179 Acute kidney failure, unspecified: Secondary | ICD-10-CM

## 2013-03-26 LAB — CBC WITH DIFFERENTIAL/PLATELET
Basophils Relative: 0 % (ref 0–1)
Eosinophils Relative: 0 % (ref 0–5)
Hemoglobin: 10.1 g/dL — ABNORMAL LOW (ref 12.0–15.0)
Lymphs Abs: 0.2 10*3/uL — ABNORMAL LOW (ref 0.7–4.0)
MCH: 30.4 pg (ref 26.0–34.0)
MCV: 88 fL (ref 78.0–100.0)
Monocytes Absolute: 0.4 10*3/uL (ref 0.1–1.0)
Monocytes Relative: 3 % (ref 3–12)
Neutrophils Relative %: 95 % — ABNORMAL HIGH (ref 43–77)
Platelets: 156 10*3/uL (ref 150–400)
RBC: 3.32 MIL/uL — ABNORMAL LOW (ref 3.87–5.11)
WBC: 11.7 10*3/uL — ABNORMAL HIGH (ref 4.0–10.5)

## 2013-03-26 LAB — BASIC METABOLIC PANEL
CO2: 20 mEq/L (ref 19–32)
Calcium: 6.6 mg/dL — ABNORMAL LOW (ref 8.4–10.5)
Chloride: 99 mEq/L (ref 96–112)
Creatinine, Ser: 3.11 mg/dL — ABNORMAL HIGH (ref 0.50–1.10)
Glucose, Bld: 103 mg/dL — ABNORMAL HIGH (ref 70–99)

## 2013-03-26 LAB — PREPARE RBC (CROSSMATCH)

## 2013-03-26 LAB — RENAL FUNCTION PANEL
BUN: 32 mg/dL — ABNORMAL HIGH (ref 6–23)
CO2: 19 mEq/L (ref 19–32)
Calcium: 6.6 mg/dL — ABNORMAL LOW (ref 8.4–10.5)
Glucose, Bld: 132 mg/dL — ABNORMAL HIGH (ref 70–99)
Phosphorus: 3.3 mg/dL (ref 2.3–4.6)

## 2013-03-26 LAB — URINALYSIS, ROUTINE W REFLEX MICROSCOPIC
Bilirubin Urine: NEGATIVE
Glucose, UA: NEGATIVE mg/dL
Hgb urine dipstick: NEGATIVE
Ketones, ur: NEGATIVE mg/dL
Protein, ur: NEGATIVE mg/dL
Urobilinogen, UA: 0.2 mg/dL (ref 0.0–1.0)

## 2013-03-26 LAB — CBC
Hemoglobin: 8.6 g/dL — ABNORMAL LOW (ref 12.0–15.0)
MCH: 31.4 pg (ref 26.0–34.0)
MCHC: 34.5 g/dL (ref 30.0–36.0)

## 2013-03-26 LAB — PHOSPHORUS: Phosphorus: 4 mg/dL (ref 2.3–4.6)

## 2013-03-26 MED ORDER — HYDROCORTISONE SOD SUCCINATE 100 MG IJ SOLR
50.0000 mg | Freq: Four times a day (QID) | INTRAMUSCULAR | Status: DC
  Administered 2013-03-27 (×2): 50 mg via INTRAVENOUS
  Filled 2013-03-26 (×7): qty 1

## 2013-03-26 MED ORDER — HYDROCORTISONE SOD SUCCINATE 100 MG IJ SOLR
50.0000 mg | Freq: Four times a day (QID) | INTRAMUSCULAR | Status: DC
  Administered 2013-03-26: 50 mg via INTRAVENOUS
  Filled 2013-03-26 (×4): qty 1

## 2013-03-26 MED ORDER — METHYLPREDNISOLONE SODIUM SUCC 125 MG IJ SOLR
125.0000 mg | Freq: Once | INTRAMUSCULAR | Status: AC
  Administered 2013-03-26: 125 mg via INTRAVENOUS
  Filled 2013-03-26 (×2): qty 2

## 2013-03-26 MED ORDER — SODIUM CHLORIDE 0.9 % IV SOLN
INTRAVENOUS | Status: DC
  Administered 2013-03-26 – 2013-03-27 (×2): via INTRAVENOUS

## 2013-03-26 MED ORDER — HYDROCORTISONE SOD SUCCINATE 100 MG PF FOR IT USE: 50.0000 mg | Freq: Four times a day (QID) | INTRAMUSCULAR | Status: DC

## 2013-03-26 NOTE — Progress Notes (Signed)
Patient ID: Kelli Swanson, female   DOB: 1946-07-05, 67 y.o.   MRN: [id number] PATIENT ID: Kelli Swanson  MRN: [id number]  DOB/AGE:  Mar 03, 1946 / 67 y.o.  3 Days Post-Op Procedure(s) (LRB): TOTAL KNEE ARTHROPLASTY WITH HARDWARE REMOVAL (Right)    PROGRESS NOTE Subjective: Patient is alert, oriented, but had episodes of hypotension as well as dizziness yesterday and last night. Patient received boluses of a total of 1.5 L of saline, and after consultation with a tried hospitalist she will receive one unit of packed red cells she also received steroids because of possible adrenal insufficiency. Since then her blood pressure is, for systolic of 110 she feels much better. Despite having good urine output immediately after surgery at 600 cc and it yesterday at 1600 cc, the patient's creatinine was noted to be 3.16 yesterday consistent with volume depletion. On repeat this morning her creatinine was 3.11. Today no Nausea, no Vomiting, yes passing gas, no Bowel Movement. Taking PO well. Denies SOB, Chest or Calf Pain. Using Incentive Spirometer, PAS in place. Ambulate weight bearing as tolerated with physical therapy, CPM 0-70 Patient reports pain as 3 on 0-10 scale  .    Objective: Vital signs in last 24 hours: Filed Vitals:   03/26/13 0213 03/26/13 0431 03/26/13 0517 03/26/13 0632  BP: 70/42 68/40 80/31  74/38  Pulse:  76 82   Temp:   98.2 F (36.8 C)   TempSrc:   Oral   Resp:   18   SpO2:   100%       Intake/Output from previous day: I/O last 3 completed shifts: In: 1674.2 [P.O.:480; I.V.:1194.2] Out: 800 [Urine:800]   Intake/Output this shift:     LABORATORY DATA:  Recent Labs  03/25/13 1955 03/26/13 0208  WBC 16.7* 12.8*  HGB 9.4* 8.6*  HCT 27.4* 24.9*  PLT 161 154  NA 125* 127*  K 5.1 4.9  CL 95* 99  CO2 20 20  BUN 29* 32*  CREATININE 3.16* 3.11*  GLUCOSE 135* 103*  CALCIUM 7.1* 6.6*    Examination: Neurologically intact ABD soft Neurovascular  intact Sensation intact distally Intact pulses distally Dorsiflexion/Plantar flexion intact Incision: no drainage No cellulitis present Compartment soft}  Assessment:   3 Days Post-Op Procedure(s) (LRB): TOTAL KNEE ARTHROPLASTY WITH HARDWARE REMOVAL (Right) ADDITIONAL DIAGNOSIS:  Renal Insufficiency Acute and Postoperative hypovolemia and possible adrenal insufficiency  Plan: PT/OT WBAT, CPM 5/hrs day until ROM 0-90 degrees, then D/C CPM DVT Prophylaxis:  SCDx72hrs, ASA 325 mg BID x 2 weeks DISCHARGE PLAN: Home, after patient has volume repletion, and her acute renal failure is resolving. In addition tried hospitalist are checking her for adrenal insufficiency. DISCHARGE NEEDS: HHPT, HHRN, CPM, Walker and 3-in-1 comode seat     Coty Student J 03/26/2013, 8:41 AM

## 2013-03-26 NOTE — Progress Notes (Signed)
Notified by RN of lab results.  Hemoglobin is drifting down, now at 8.6.  Second troponin is negative.  BMP shows slight improvement in sodium and stable creatinine at 3.11.  UA was completely normal.  Cortisol is 10.6.  Blood pressure remains low in the 60-70s systolic. Patient remains asymptomatic.   Discussed with on call physician for primary team.  - Will cut back IVFs to 100cc/hr. - Transfuse 1 unit pRBCs with f/u CBC. - Will give one dose of IV steroids given cortisol of 10.6 when I would expect it to be >20 with hypotension and recent surgery.  No clear reason for her to be adrenally insufficient with no history of chronic steroid use.  Will defer to day team for continuation of steroids. - No clear cause for AKI with relatively normal BUN and normal UA - will check a FENa and repeat renal panel at 10am. - Hypocalcemic at 6.6 - will add on a phosphorus and albumin levels. - May need to consider renal consult if creatinine does not improve.  BOOTH, Keagon Glascoe 03/26/2013, 5:06 AM

## 2013-03-26 NOTE — Progress Notes (Signed)
Family Medicine Teaching Service Daily Progress Note Intern Pager: [phone]  Patient name: Kelli Swanson Medical record number: [id number] Date of birth: 10-Jul-1946 Age: 67 y.o. Gender: female  Primary Care Provider: Tally Due, MD Code Status: Full Code  Pt Overview and Major Events to Date:   Assessment and Plan: STELLAROSE CERNY is a 67 y.o. year old female with hypertension presenting with low blood pressures after a right total knee arthroplasty.   # Hypotension: Asymptomatic except for slight light headedness when standing. Differential includes blood loss, dehydration, infection, medications, ACS, PE. PE is unlikely with no signs of DVT on exam and no tachycardia. ACS also unlikely with no chest pain and a normal EKG. Infection is possible with a rise in her white count, but she is afebrile and has no clear source based on history and exam. Blood loss is also possible, but hemoglobin remains stable around 9.5 following surgery. Dehydration is also possible with reported poor PO fluid intake. Medications are also likely contributing as she has received dilaudid, several doses of oxycodone and zanaflex.  - discontinued zanaflex - discontinue benadryl - lactic acid normal - troponin negative x2 - start solu cortef 50mg  QID for 2-3 days - monitor closely for fevers with low threshold for CXR/blood cultures and starting antibiotics [ ]  f/u ACTH   # AKI - baseline is ~1 - 1.1 creatinine currently improved to 2.78 from 3.11 - FENa calculation suggests prerenal cause - continue NS IVF @100ml /h  # Right total knee arthroplasty  - appears to be doing well - management per primary team   FEN/GI: regular diet, NS IVF @150ml /hr Prophylaxis: ASA daily   Disposition: pending improvement in BP  Subjective: Patient has no complaints overnight. Has not been out of bed. No symptoms of dizziness, no chest pain, shortness of breath.  Objective: Temp:  [97.9 F (36.6 C)-99 F  (37.2 C)] 99 F (37.2 C) (08/14 1105) Pulse Rate:  [63-82] 63 (08/14 1200) Resp:  [16-18] 18 (08/14 1200) BP: (52-140)/(24-100) 140/62 mmHg (08/14 1440) SpO2:  [99 %-100 %] 100 % (08/14 0517)  Physical Exam: General: in bed, in no acute distress Cardiovascular: RRR, no murmur Respiratory: Clear to auscultation Abdomen: soft, non-tender, non-distended Extremities: no edema  Laboratory:  Recent Labs Lab 03/25/13 0500 03/25/13 1955 03/26/13 0208  WBC 14.4* 16.7* 12.8*  HGB 9.7* 9.4* 8.6*  HCT 28.1* 27.4* 24.9*  PLT 190 161 154    Recent Labs Lab 03/25/13 1955 03/26/13 0208 03/26/13 0827  NA 125* 127* 128*  K 5.1 4.9 5.3*  CL 95* 99 99  CO2 20 20 19   BUN 29* 32* 32*  CREATININE 3.16* 3.11* 2.78*  CALCIUM 7.1* 6.6* 6.6*  GLUCOSE 135* 103* 132*    FENa: 3%  Imaging/Diagnostic Tests: None  Jacquelin Hawking, MD 03/26/2013, 4:03 PM PGY-1, [facility] FPTS Intern pager: [phone], text pages welcome

## 2013-03-26 NOTE — Progress Notes (Addendum)
Physical Therapy Treatment Patient Details Name: Kelli Swanson MRN: [id number] DOB: Dec 27, 1945 Today's Date: 03/26/2013 Time: 7829-5621 PT Time Calculation (min): 31 min  PT Assessment / Plan / Recommendation  History of Present Illness s/p elective Rt TKA    PT Comments   Pt seen QD due to 8.6 hgb and pt receiving blood this morning. Pt slowly progressing with therapy. Requires min (A) for bed mobility and transfers. Pt unsteady and has decreased safety awareness with transfers. Pt will require 24/7 (A) with transfers and mobility upon D/C at this time. Pt is adamantly refusing STSNF; is highly motivated to return home with husband. Checked orthostatics; in supine BP 120/66; in sitting EOB 132/100; in standing 140/62. Pt asymptomatic for orthostatic.   Follow Up Recommendations  Home health PT;Supervision/Assistance - 24 hour     Does the patient have the potential to tolerate intense rehabilitation     Barriers to Discharge        Equipment Recommendations  None recommended by PT    Recommendations for Other Services    Frequency 7X/week   Progress towards PT Goals Progress towards PT goals: Progressing toward goals  Plan Current plan remains appropriate    Precautions / Restrictions Precautions Precautions: Fall;Knee Precaution Comments: reiterated no pillow under knee Restrictions Weight Bearing Restrictions: Yes RLE Weight Bearing: Weight bearing as tolerated   Pertinent Vitals/Pain 6/10 "better than before i got out of bed"     Mobility  Bed Mobility Bed Mobility: Sit to Supine Supine to Sit: 4: Min guard;HOB flat;With rails Sitting - Scoot to Edge of Bed: 5: Supervision;With rail Details for Bed Mobility Assistance: pt encouraged to attempt bed mobility without AD; able to come to long sitting then with sheet can advance Rt LE off EOB; requires increased time and max cues and encouragement  Transfers Transfers: Sit to Stand;Stand to Sit Sit to Stand: 4: Min  assist;From elevated surface;From bed;With upper extremity assist Stand to Sit: 4: Min assist;To chair/3-in-1;With armrests;With upper extremity assist Details for Transfer Assistance: pt with increased difficulty achieving sit to stand from bed; was more stable with transfer from 3 in 1; (A) to achieve upright standing position; pt tends to lean too far anteriorly and requries (A) to extend at trunk; multimodal cues for upright posture and vc's for hand placement/safety with RW  Ambulation/Gait Ambulation/Gait Assistance: 4: Min assist Ambulation Distance (Feet): 16 Feet Assistive device: Rolling walker Ambulation/Gait Assistance Details: pt continues to amb with fwd flex trunk and looking at floor; multimodal cues for upright posture and gt sequencing; min (A) to maintain balance and manage RW  Gait Pattern: Step-to pattern;Decreased stance time - right;Decreased step length - left;Trunk flexed Gait velocity: decreased Stairs: No Wheelchair Mobility Wheelchair Mobility: No    Exercises Total Joint Exercises Ankle Circles/Pumps: AROM;Both;10 reps;Supine Quad Sets: AROM;Right;10 reps;Seated Heel Slides: PROM;Right;10 reps;Supine (pt very guarded due to pain') Hip ABduction/ADduction: AAROM;Right;10 reps;Seated Goniometric ROM: knee flexion in sitting AAROM 45 degrees limited by pain   PT Diagnosis:    PT Problem List:   PT Treatment Interventions:     PT Goals (current goals can now be found in the care plan section) Acute Rehab PT Goals Patient Stated Goal: to go home with husband  PT Goal Formulation: With patient Time For Goal Achievement: 03/31/13 Potential to Achieve Goals: Good  Visit Information  Last PT Received On: 03/26/13 Assistance Needed: +1 History of Present Illness: s/p elective Rt TKA     Subjective Data  Subjective: "  i just cant use this bed pan. I havent been up since you saw me last"  Patient Stated Goal: to go home with husband    Cognition   Cognition Arousal/Alertness: Awake/alert Behavior During Therapy: WFL for tasks assessed/performed Overall Cognitive Status: Within Functional Limits for tasks assessed    Balance  Balance Balance Assessed: Yes Static Standing Balance Static Standing - Balance Support: During functional activity;Right upper extremity supported (Lt UE performing ADL) Static Standing - Level of Assistance: 4: Min assist Static Standing - Comment/# of Minutes: while performing perineal hygiene care in bathroom; pt demo increased anterior weightshift and was using Rt UE to support on RW; pt unsafe with technique and requires (A) to maintain balance   End of Session PT - End of Session Equipment Utilized During Treatment: Gait belt Activity Tolerance: Patient limited by fatigue Patient left: in chair;with call bell/phone within reach Nurse Communication: Mobility status   GP     Chad, Jordan, PT  03/26/2013, 2:50 PM

## 2013-03-26 NOTE — Progress Notes (Signed)
Orthopedic Tech Progress Note Patient Details:  Kelli Swanson 01/12/1946 [id number] On cpm at 8:20 pm RLE 0-50 Patient ID: Kelli Swanson, female   DOB: 1946/05/09, 67 y.o.   MRN: [id number]   Kelli Swanson 03/26/2013, 8:19 PM

## 2013-03-26 NOTE — Progress Notes (Signed)
Per pt report she has only voided twice on 8/13 and voided very little each time. Attempted bedpan with no result. I&O cath done at 0030, with 800 ml output. Urine amber, clear, malodorous. UA sent. Will attempt to get pt up to [facility] once BP increases to prevent dizziness. MD aware. Will continue to monitor.

## 2013-03-26 NOTE — Consult Note (Signed)
FMTS Attending Note Patient seen and examined by me, discussed with resident team and I agree with Dr Virgie Dad assessment and plan.  Patient is post-operative POD #3 s/p R knee arthroplasty with PMHx HTN who developed hypotension yesterday along with a bump in serum Creatinine.  Downward drift in Hgb postoperatively as well.  Her serum cortisol is 10.  Serial Troponins are negative.  Had in-and-out cath which yielded 800cc urine.  Denies dizziness or chest pain. Last BM was before surgery 4 days ago, which is not unsual for her.   On exam she is alert, reports feeling relatively well, was able to sleep despite frequent interruptions throughout the night.  Bedside BP at time of exam 110/34. Neck supple.  PULM bibasilar crackles heard on exam.  COR Regular S1S2 ABD Soft, nontender. Nondistended.  A/P: hypotension likely secondary to adrenal insufficiency postoperatively.  Has already received solumedrol, has had improvement in blood pressure (possibly in response to steroids vs NS boluses).  A unit of PRBCs is already ordered and will provide further volume expansion.  To continue to monitor BP, I/O closely.  If further hypotension would have low threshold to transfer to SDU.  Close followup of serum creatinine, which may be related to hypovolemia/hypotension. Given bibasilar crackles would consider CXR. Paula Compton, MD

## 2013-03-27 LAB — TYPE AND SCREEN: ABO/RH(D): B NEG

## 2013-03-27 LAB — CBC
HCT: 29 % — ABNORMAL LOW (ref 36.0–46.0)
MCH: 30 pg (ref 26.0–34.0)
MCV: 87.9 fL (ref 78.0–100.0)
Platelets: 176 10*3/uL (ref 150–400)
RBC: 3.3 MIL/uL — ABNORMAL LOW (ref 3.87–5.11)
WBC: 11.5 10*3/uL — ABNORMAL HIGH (ref 4.0–10.5)

## 2013-03-27 LAB — BASIC METABOLIC PANEL
CO2: 19 mEq/L (ref 19–32)
Calcium: 6.9 mg/dL — ABNORMAL LOW (ref 8.4–10.5)
Chloride: 107 mEq/L (ref 96–112)
Glucose, Bld: 132 mg/dL — ABNORMAL HIGH (ref 70–99)
Sodium: 137 mEq/L (ref 135–145)

## 2013-03-27 NOTE — Progress Notes (Signed)
Occupational Therapy Treatment Patient Details Name: Kelli Swanson MRN: [id number] DOB: December 01, 1945 Today's Date: 03/27/2013 Time: 1324-4010 OT Time Calculation (min): 8 min  OT Assessment / Plan / Recommendation  History of present illness s/p elective Rt TKA    OT comments  Pt is at adequate level for d/c home today. NO further OT needs. Ot to sign off   Follow Up Recommendations  Supervision/Assistance - 24 hour;No OT follow up    Barriers to Discharge       Equipment Recommendations  Other (comment)    Recommendations for Other Services    Frequency Min 2X/week   Progress towards OT Goals Progress towards OT goals: Goals met/education completed, patient discharged from OT  Plan Discharge plan remains appropriate    Precautions / Restrictions Precautions Precautions: Fall;Knee Restrictions Weight Bearing Restrictions: Yes RLE Weight Bearing: Weight bearing as tolerated   Pertinent Vitals/Pain  no pain reported during session    ADL  ADL Comments: Pt declining tub transfer that was reason for session on arrival stating she plans to sponge bath. Pt was able to touch LT LE with knee flexion for LB dressing. pt can reach ankle of Rt LE for dressing. Pt educated on safety with LB dressing and dressing Rt LE first. pt has (A) of husband upon d/c so no AE education needed. Pt educated on car transfer for d/c today. Pt with no further questions and all education complete    OT Diagnosis:    OT Problem List:   OT Treatment Interventions:     OT Goals(current goals can now be found in the care plan section) Acute Rehab OT Goals Patient Stated Goal: home today  OT Goal Formulation: With patient Time For Goal Achievement: 04/08/13 Potential to Achieve Goals: Good ADL Goals Pt Will Transfer to Toilet: with modified independence;bedside commode Pt Will Perform Tub/Shower Transfer: with supervision  Visit Information  Last OT Received On: 03/27/13 Assistance Needed:  +1 History of Present Illness: s/p elective Rt TKA     Subjective Data      Prior Functioning       Cognition  Cognition Arousal/Alertness: Awake/alert Behavior During Therapy: WFL for tasks assessed/performed Overall Cognitive Status: Within Functional Limits for tasks assessed    Mobility  Bed Mobility Bed Mobility: Not assessed Supine to Sit: 5: Supervision;HOB flat Sitting - Scoot to Edge of Bed: 5: Supervision Details for Bed Mobility Assistance: pt reports not deficits Transfers Sit to Stand: 5: Supervision;With upper extremity assist;From chair/3-in-1 Stand to Sit: 5: Supervision;With upper extremity assist;To chair/3-in-1 Details for Transfer Assistance: pt performed sit <> stand transfer bed <> 3 in 1 <> chair; pt increased independence today and required min guard for safety with sit to stand only for safety and balance; min vc's for hand placement and sequencing     Exercises  Total Joint Exercises Ankle Circles/Pumps: AROM;15 reps;Seated Quad Sets: AROM;Right;10 reps;Seated Hip ABduction/ADduction: AROM;10 reps;Supine Long Arc Quad: AROM;Right;10 reps;Seated Knee Flexion: AAROM;Right;10 reps;Standing Goniometric ROM: knee flexiong AROM in sitting 60 degrees    Balance Balance Balance Assessed: Yes Static Standing Balance Static Standing - Balance Support: During functional activity;Right upper extremity supported Static Standing - Level of Assistance: 5: Stand by assistance Static Standing - Comment/# of Minutes: pt progressed to SBA only for toileting and hygiene   End of Session OT - End of Session Activity Tolerance: Patient tolerated treatment well Patient left: in chair;with call bell/phone within reach  GO     Lucile Shutters 03/27/2013,  9:54 AM  Pager: [phone]

## 2013-03-27 NOTE — Progress Notes (Signed)
I discussed with  Dr Nettey.  I agree with their plans documented in their progress note for today.  

## 2013-03-27 NOTE — Progress Notes (Signed)
PATIENT ID: Kelli Swanson  MRN: [id number]  DOB/AGE:  1945-09-02 / 67 y.o.  4 Days Post-Op Procedure(s) (LRB): TOTAL KNEE ARTHROPLASTY WITH HARDWARE REMOVAL (Right)    PROGRESS NOTE Subjective: Patient is alert, oriented, no Nausea, no Vomiting, yes passing gas, Scant Bowel Movement. Taking PO well. Denies SOB, Chest or Calf Pain. Using Incentive Spirometer, PAS in place. Ambulate WBAT, CPM 0-50 Patient reports pain as mild  .    Objective: Vital signs in last 24 hours: Filed Vitals:   03/26/13 2041 03/27/13 0000 03/27/13 0400 03/27/13 0522  BP: 143/60   124/46  Pulse: 102   63  Temp: 98.3 F (36.8 C)   98.6 F (37 C)  TempSrc: Oral   Oral  Resp: 18 20 20 18   SpO2: 100%   100%      Intake/Output from previous day: I/O last 3 completed shifts: In: 2284.2 [P.O.:1070; I.V.:1214.2] Out: 1702 [Urine:1702]   Intake/Output this shift:     LABORATORY DATA:  Recent Labs  03/26/13 0827 03/26/13 2113 03/27/13 0603  WBC  --  11.7* 11.5*  HGB  --  10.1* 9.9*  HCT  --  29.2* 29.0*  PLT  --  156 176  NA 128*  --  137  K 5.3*  --  4.6  CL 99  --  107  CO2 19  --  19  BUN 32*  --  25*  CREATININE 2.78*  --  PENDING  GLUCOSE 132*  --  132*  CALCIUM 6.6*  --  6.9*    Examination: Neurologically intact ABD soft Neurovascular intact Sensation intact distally Intact pulses distally Dorsiflexion/Plantar flexion intact Incision: no drainage Compartment soft}  Assessment:   4 Days Post-Op Procedure(s) (LRB): TOTAL KNEE ARTHROPLASTY WITH HARDWARE REMOVAL (Right) ADDITIONAL DIAGNOSIS:  Renal Insufficiency Acute  Plan: PT/OT WBAT, CPM 5/hrs day until ROM 0-90 degrees, then D/C CPM DVT Prophylaxis:  SCDx72hrs, ASA 325 mg BID x 2 weeks DISCHARGE PLAN: Home DISCHARGE NEEDS: HHPT, HHRN, CPM, Walker and 3-in-1 comode seat  Plan to discharge whn pt is cleared with primarycare/ internal medicine for AKI and hypotension.     Messiah Ahr R 03/27/2013, 7:27 AM

## 2013-03-27 NOTE — Progress Notes (Signed)
SW received a consult for possible placement. PT  At this time is recommending home with HH and not SNF. CM aware. Clinical Social Worker will sign off for now as social work intervention is no longer needed. Please consult us again if new need arises.   Kelli Swanson, MSW [phone] 

## 2013-03-27 NOTE — Progress Notes (Signed)
Physical Therapy Treatment Patient Details Name: Kelli Swanson MRN: [id number] DOB: 03-01-1946 Today's Date: 03/27/2013 Time: 9562-1308 PT Time Calculation (min): 24 min  PT Assessment / Plan / Recommendation  History of Present Illness s/p elective Rt TKA    PT Comments   Pt improving slowly with each session. Pt is confident with support she has at home. Is amb and transferring at supervision level for safety. Pt encouraged to have (A) with OOB activities due to fall risk. Pt is at adequate level of mobility for D/C home with 24/7 (A). Discussed car transfer technique with pt; pt verbalized understanding.   Follow Up Recommendations  Home health PT;Supervision/Assistance - 24 hour     Does the patient have the potential to tolerate intense rehabilitation     Barriers to Discharge        Equipment Recommendations  None recommended by PT    Recommendations for Other Services    Frequency 7X/week   Progress towards PT Goals Progress towards PT goals: Progressing toward goals  Plan Current plan remains appropriate    Precautions / Restrictions Precautions Precautions: Fall;Knee Restrictions Weight Bearing Restrictions: Yes RLE Weight Bearing: Weight bearing as tolerated   Pertinent Vitals/Pain "just sore, no pain"     Mobility  Bed Mobility Bed Mobility: Sit to Supine Supine to Sit: 5: Supervision;HOB flat Sitting - Scoot to Edge of Bed: 5: Supervision Sit to Supine: 5: Supervision;HOB flat Details for Bed Mobility Assistance: pt able to perform sit to supine without physical (A); requires increased time; given cues for techniques; pt able to advance Rt LE without use of UEs or Lt LE  today but with increased effort and energy exertion; supervision for safety and cues   Transfers Transfers: Sit to Stand;Stand to Sit Sit to Stand: 5: Supervision;From chair/3-in-1;With armrests Stand to Sit: 5: Supervision;To bed Details for Transfer Assistance: pt continues to require  supervision for sit to stand transfers; encouraged to have (A) and supervision when OOB at home ; min cues for safety and hand placement  Ambulation/Gait Ambulation/Gait Assistance: 5: Supervision Ambulation Distance (Feet): 70 Feet Assistive device: Rolling walker Ambulation/Gait Assistance Details: pt slowly progressing with gt; continues to amb with fwd flex posture due to fear of falling and anxiety; pt encouraged to increase heel strike and toe off on Rt LE; pt amb with foot flat and decreased hip/knee flexion; pt demo min SOB with activity; highly motivated to increase amb  Gait Pattern: Step-to pattern;Decreased stance time - right;Decreased step length - left;Trunk flexed;Step-through pattern Gait velocity: decreased Stairs: No Wheelchair Mobility Wheelchair Mobility: No    Exercises Total Joint Exercises Ankle Circles/Pumps: AROM;15 reps;Seated Quad Sets: AROM;Right;10 reps;Seated Short Arc Quad: AAROM;Right;5 reps;Seated Hip ABduction/ADduction: AROM;Right;10 reps;Seated Straight Leg Raises: AAROM;Right;5 reps;Seated Long Arc Quad: AROM;Right;10 reps;Seated Knee Flexion: AAROM;Right;10 reps;Standing Goniometric ROM: knee flexiong AROM in sitting 60 degrees    PT Diagnosis:    PT Problem List:   PT Treatment Interventions:     PT Goals (current goals can now be found in the care plan section) Acute Rehab PT Goals Patient Stated Goal: home today  PT Goal Formulation: With patient Time For Goal Achievement: 03/31/13 Potential to Achieve Goals: Good  Visit Information  Last PT Received On: 03/27/13 Assistance Needed: +1 History of Present Illness: s/p elective Rt TKA     Subjective Data  Subjective: "I thought id be gone by now. Im ready to get home. lets go to the bathroom before all this"  Patient Stated  Goal: home today    Cognition  Cognition Arousal/Alertness: Awake/alert Behavior During Therapy: WFL for tasks assessed/performed Overall Cognitive Status:  Within Functional Limits for tasks assessed    Balance  Balance Balance Assessed: No Static Standing Balance Static Standing - Balance Support: During functional activity;Right upper extremity supported Static Standing - Level of Assistance: 5: Stand by assistance Static Standing - Comment/# of Minutes: pt progressed to SBA only for toileting and hygiene  End of Session PT - End of Session Equipment Utilized During Treatment: Gait belt Activity Tolerance: Patient tolerated treatment well Patient left: with call bell/phone within reach;in bed Nurse Communication: Mobility status   GP     Donell Sievert, [place] [phone] 03/27/2013, 10:56 AM

## 2013-03-27 NOTE — Progress Notes (Signed)
Physical Therapy Treatment Patient Details Name: Kelli Swanson MRN: [id number] DOB: [birth year] Today's Date: 03/27/2013 Time: 4098-1191 PT Time Calculation (min): 30 min  PT Assessment / Plan / Recommendation  History of Present Illness s/p elective Rt TKA    PT Comments   Pt moving a lot better and with increased stability today. Pt able to increase independence with amb and transfers. Plans to D/C home today. Will attempt to see another time prior to D/C to review transfers and mobility. Pt will  Require (A) for OOB activities when she D/C home.  BP was more stable with activity; supine BP: 135/60; Sitting EOB BP 143/57.   Follow Up Recommendations  Home health PT;Supervision/Assistance - 24 hour     Does the patient have the potential to tolerate intense rehabilitation     Barriers to Discharge        Equipment Recommendations  None recommended by PT    Recommendations for Other Services    Frequency 7X/week   Progress towards PT Goals Progress towards PT goals: Progressing toward goals  Plan Current plan remains appropriate    Precautions / Restrictions Precautions Precautions: Fall;Knee Restrictions Weight Bearing Restrictions: Yes RLE Weight Bearing: Weight bearing as tolerated   Pertinent Vitals/Pain 6/10    Mobility  Bed Mobility Bed Mobility: Supine to Sit;Sitting - Scoot to Edge of Bed Supine to Sit: 5: Supervision;HOB flat Sitting - Scoot to [place] of Bed: 5: Supervision Details for Bed Mobility Assistance: simulated home enviroment for bed mobility with HOB flat and discontinued use of handrails; pt required supervision for safety and cues but no phyiscal (A) pt was able to advance Rt LE to/off EOB with increased time  Transfers Transfers: Sit to Stand;Stand to Sit Sit to Stand: 4: Min guard;From bed Stand to Sit: To chair/3-in-1;5: Supervision Details for Transfer Assistance: pt performed sit <> stand transfer bed <> 3 in 1 <> chair; pt increased  independence today and required min guard for safety with sit to stand only for safety and balance; min vc's for hand placement and sequencing  Ambulation/Gait Ambulation/Gait Assistance: 5: Supervision Ambulation Distance (Feet): 60 Feet Assistive device: Rolling walker Ambulation/Gait Assistance Details: vc's for gt sequencing and safety with RW; pt slowly progressing to step through gt with max encouragement and facilitation of RW; mod vc's for upright posture; pt continues to rely heavily on UEs and becomes fatigued quickly requiring 2 standing rest breaks  Gait Pattern: Step-to pattern;Decreased stance time - right;Decreased step length - left;Trunk flexed;Step-through pattern Gait velocity: decreased Stairs: No Wheelchair Mobility Wheelchair Mobility: No    Exercises Total Joint Exercises Ankle Circles/Pumps: AROM;Both;10 reps;Supine Quad Sets: AROM;Right;10 reps;Seated Hip ABduction/ADduction: AROM;10 reps;Supine Long Arc Quad: AROM;Right;10 reps;Seated Knee Flexion: AAROM;Right;10 reps;Standing Goniometric ROM: knee flexiong AROM in sitting 60 degrees    PT Diagnosis:    PT Problem List:   PT Treatment Interventions:     PT Goals (current goals can now be found in the care plan section) Acute Rehab PT Goals Patient Stated Goal: home today  PT Goal Formulation: With patient Time For Goal Achievement: 03/31/13 Potential to Achieve Goals: Good  Visit Information  Last PT Received On: 03/27/13 History of Present Illness: s/p elective Rt TKA     Subjective Data  Subjective: "i was up and down 3 times last night. Minerva Areola said i could go home today"  Patient Stated Goal: home today    Cognition  Cognition Arousal/Alertness: Awake/alert Behavior During Therapy: [facility] for tasks assessed/performed Overall Cognitive  Status: Within Functional Limits for tasks assessed    Balance  Balance Balance Assessed: Yes Static Standing Balance Static Standing - Balance Support: During  functional activity;Right upper extremity supported Static Standing - Level of Assistance: 5: Stand by assistance Static Standing - Comment/# of Minutes: pt progressed to SBA only for toileting and hygiene  End of Session PT - End of Session Equipment Utilized During Treatment: Gait belt Activity Tolerance: Patient tolerated treatment well Patient left: in chair;with call bell/phone within reach Nurse Communication: Mobility status   GP     Donell Sievert, [place] [phone] 03/27/2013, 9:15 AM

## 2013-03-27 NOTE — Progress Notes (Addendum)
Family Medicine Teaching Service Daily Progress Note Intern Pager: [phone]  Patient name: Kelli Swanson Medical record number: [id number] Date of birth: 1946-05-11 Age: 67 y.o. Gender: female  Primary Care Provider: Tally Due, MD Code Status: Full Code  Pt Overview and Major Events to Date:   Assessment and Plan: Kelli Swanson is a 67 y.o. year old female with hypertension presenting with low blood pressures after a right total knee arthroplasty.   # Hypotension: Asymptomatic except for slight light headedness when standing. Differential includes blood loss, dehydration, infection, medications, ACS, PE. PE is unlikely with no signs of DVT on exam and no tachycardia. ACS also unlikely with no chest pain and a normal EKG. Infection is possible with a rise in her white count, but she is afebrile and has no clear source based on history and exam. Blood loss is also possible, but hemoglobin remains stable around 9.5 following surgery. Dehydration is also possible with reported poor PO fluid intake. Medications are also likely contributing as she has received dilaudid, several doses of oxycodone and zanaflex. Blood pressure improved overall. 90s-140s/ 40s-90s.  - discontinued zanaflex - discontinue benadryl - continue solu cortef 50mg  QID for 2-3 days total (Day #2) - monitor closely for fevers with low threshold for CXR/blood cultures and starting antibiotics [ ]  f/u ACTH   # AKI - baseline is ~1 - 1.1 creatinine currently improved to 1.33 from 3.11. Currently eating and drinking PO with no complications - FENa calculation suggests prerenal cause - continue NS IVF @100ml /h  # Anemia - s/p 1 unit RBCs. Pre/post transfusion Hb: 8.6/10.1 - RBC smear shows teardrop cells; suggestive of myelodysplasia. F/u outpatient  # Right total knee arthroplasty  - appears to be doing well - management per primary team   FEN/GI: regular diet, NS IVF @150ml /hr Prophylaxis: ASA daily    Disposition: possible home today, and pending primary team decision  Subjective: Had Patient has no complaints overnight. Has been out of bed and walking with physical therapy. No symptoms of dizziness, no chest pain, shortness of breath.  Objective: Temp:  [97.6 F (36.4 C)-99 F (37.2 C)] 98.6 F (37 C) (08/15 0522) Pulse Rate:  [63-102] 63 (08/15 0522) Resp:  [17-20] 18 (08/15 0522) BP: (94-143)/(42-100) 124/46 mmHg (08/15 0522) SpO2:  [100 %] 100 % (08/15 0522)  Physical Exam: General: in her chair, in no acute distress Cardiovascular: RRR, no murmur Respiratory: Clear to auscultation Abdomen: soft, non-tender, non-distended Extremities: 1+ pitting edema. Leg is overall swollen  Laboratory:  Recent Labs Lab 03/26/13 0208 03/26/13 2113 03/27/13 0603  WBC 12.8* 11.7* 11.5*  HGB 8.6* 10.1* 9.9*  HCT 24.9* 29.2* 29.0*  PLT 154 156 176    Recent Labs Lab 03/26/13 0208 03/26/13 0827 03/27/13 0603  NA 127* 128* 137  K 4.9 5.3* 4.6  CL 99 99 107  CO2 20 19 19   BUN 32* 32* 25*  CREATININE 3.11* 2.78* 1.33*  CALCIUM 6.6* 6.6* 6.9*  GLUCOSE 103* 132* 132*    FENa: 3%  Imaging/Diagnostic Tests: None  Kelli Hawking, MD 03/27/2013, 9:51 AM PGY-1, [facility] FPTS Intern pager: [phone], text pages welcome

## 2013-04-16 ENCOUNTER — Ambulatory Visit: Payer: Medicare Other | Attending: Orthopedic Surgery | Admitting: Physical Therapy

## 2013-04-16 DIAGNOSIS — R609 Edema, unspecified: Secondary | ICD-10-CM | POA: Insufficient documentation

## 2013-04-16 DIAGNOSIS — IMO0001 Reserved for inherently not codable concepts without codable children: Secondary | ICD-10-CM | POA: Insufficient documentation

## 2013-04-16 DIAGNOSIS — M25569 Pain in unspecified knee: Secondary | ICD-10-CM | POA: Insufficient documentation

## 2013-04-16 DIAGNOSIS — Z96659 Presence of unspecified artificial knee joint: Secondary | ICD-10-CM | POA: Insufficient documentation

## 2013-04-21 ENCOUNTER — Ambulatory Visit: Payer: Medicare Other | Admitting: Physical Therapy

## 2013-04-23 ENCOUNTER — Ambulatory Visit: Payer: Medicare Other | Admitting: Physical Therapy

## 2013-04-27 ENCOUNTER — Ambulatory Visit (INDEPENDENT_AMBULATORY_CARE_PROVIDER_SITE_OTHER): Payer: Medicare Other | Admitting: Family Medicine

## 2013-04-27 VITALS — BP 102/68 | HR 74 | Temp 98.4°F | Resp 18 | Ht 65.0 in | Wt 211.0 lb

## 2013-04-27 DIAGNOSIS — I10 Essential (primary) hypertension: Secondary | ICD-10-CM

## 2013-04-27 DIAGNOSIS — D649 Anemia, unspecified: Secondary | ICD-10-CM

## 2013-04-27 DIAGNOSIS — Z23 Encounter for immunization: Secondary | ICD-10-CM

## 2013-04-27 LAB — BASIC METABOLIC PANEL
CO2: 27 mEq/L (ref 19–32)
Chloride: 100 mEq/L (ref 96–112)
Creat: 1.08 mg/dL (ref 0.50–1.10)
Glucose, Bld: 104 mg/dL — ABNORMAL HIGH (ref 70–99)

## 2013-04-27 LAB — POCT CBC
HCT, POC: 40.8 % (ref 37.7–47.9)
Hemoglobin: 12.5 g/dL (ref 12.2–16.2)
Lymph, poc: 1.9 (ref 0.6–3.4)
MCH, POC: 29.5 pg (ref 27–31.2)
MCV: 96.3 fL (ref 80–97)
MPV: 11.1 fL (ref 0–99.8)
POC MID %: 5.9 %M (ref 0–12)
RBC: 4.24 M/uL (ref 4.04–5.48)
WBC: 7.4 10*3/uL (ref 4.6–10.2)

## 2013-04-27 NOTE — Patient Instructions (Addendum)
Cut back to one of your BP pills.  Please keep an eye on your blood pressure.  If it remains less than 120/80 you can stop the medication all-together  Please give me a call if you have any questions or concerns.  I will be in touch with the rest of your labs.
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
## 2014-03-08 ENCOUNTER — Encounter: Payer: Self-pay | Admitting: Family Medicine

## 2014-04-23 ENCOUNTER — Encounter: Payer: Self-pay | Admitting: Internal Medicine

## 2014-04-23 ENCOUNTER — Encounter: Payer: Self-pay | Admitting: Family Medicine

## 2014-04-28 ENCOUNTER — Ambulatory Visit (INDEPENDENT_AMBULATORY_CARE_PROVIDER_SITE_OTHER): Payer: Medicare Other | Admitting: Family Medicine

## 2014-04-28 VITALS — BP 122/84 | HR 73 | Temp 97.8°F | Resp 18 | Ht 65.0 in | Wt 221.0 lb

## 2014-04-28 DIAGNOSIS — I10 Essential (primary) hypertension: Secondary | ICD-10-CM

## 2014-04-28 DIAGNOSIS — J0111 Acute recurrent frontal sinusitis: Secondary | ICD-10-CM

## 2014-04-28 DIAGNOSIS — J0110 Acute frontal sinusitis, unspecified: Secondary | ICD-10-CM

## 2014-04-28 MED ORDER — AZITHROMYCIN 250 MG PO TABS: ORAL_TABLET | ORAL | Status: DC

## 2014-04-28 MED ORDER — LISINOPRIL-HYDROCHLOROTHIAZIDE 10-12.5 MG PO TABS: 1.0000 | ORAL_TABLET | Freq: Every day | ORAL | Status: DC

## 2014-04-28 NOTE — Progress Notes (Signed)
68 yo woman working part time in [employer].  She has had a week of headache, chills, and night sweats.  Husband recently dx'd with strep. Mild nausea No UTI sx No rash or tick bite  Objective: NAD Neck supple, no adenop Chest clear Heart:  Reg HEENT:  S/p tonsillectomy, mild erythema;  TM's normal  Assessment:  Sinusitis  Plan:  z-pak  Robyn Haber, MD

## 2014-04-28 NOTE — Addendum Note (Signed)
Addended by: Robyn Haber on: 04/28/2014 08:34 AM   Modules accepted: Orders

## 2014-07-15 IMAGING — CR DG CHEST 2V
2 series · 2 of 2 positions shown · non-contrast
Comparison: None.

***ADDENDUM*** CREATED: 03/13/2013 [DATE]

Sclerotic density is noted in the proximal right humerus;
radiographs of this area are recommended for further evaluation.
CLINICAL DATA: Hypertension
CHEST - 2 VIEW

[w chest pa]
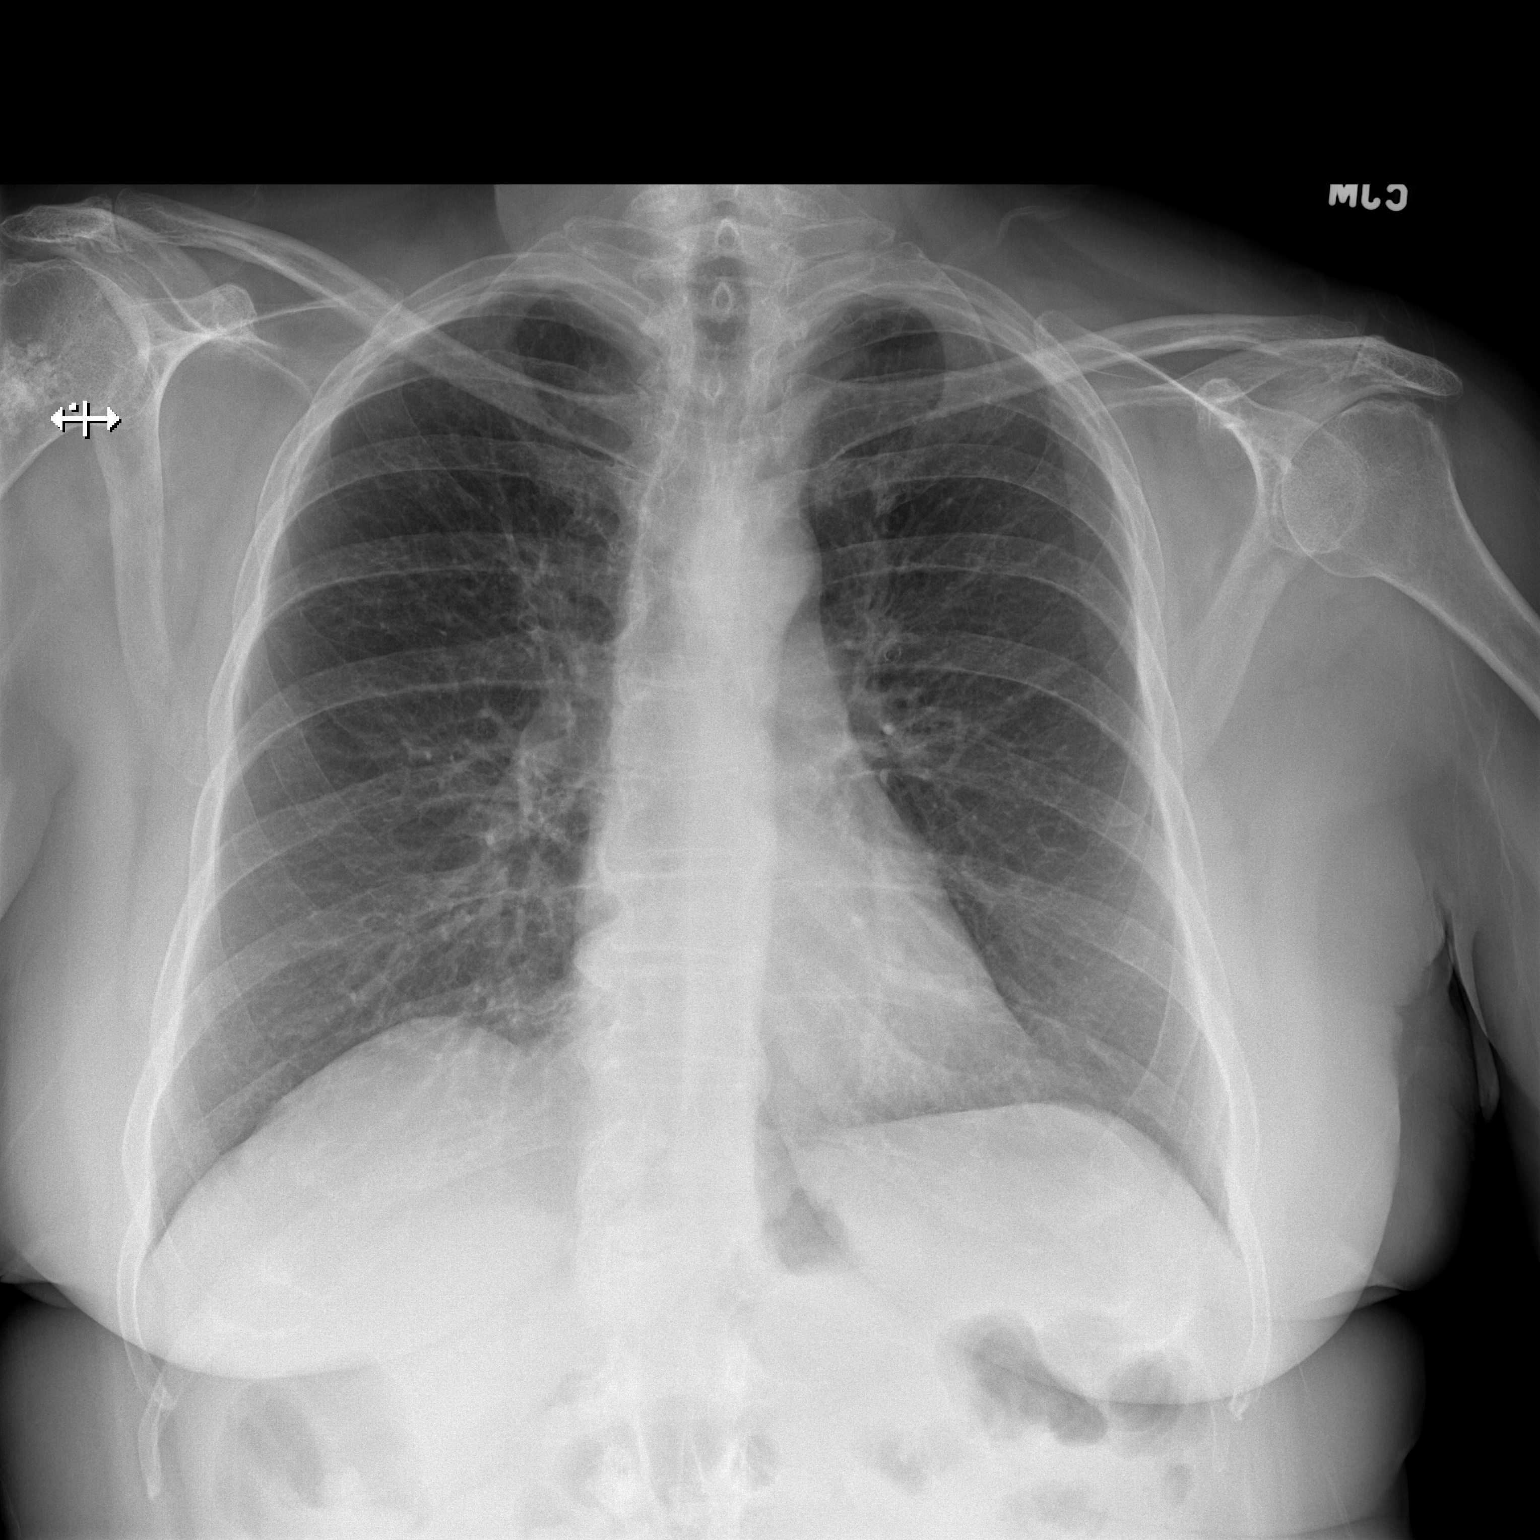

[w chest lat]
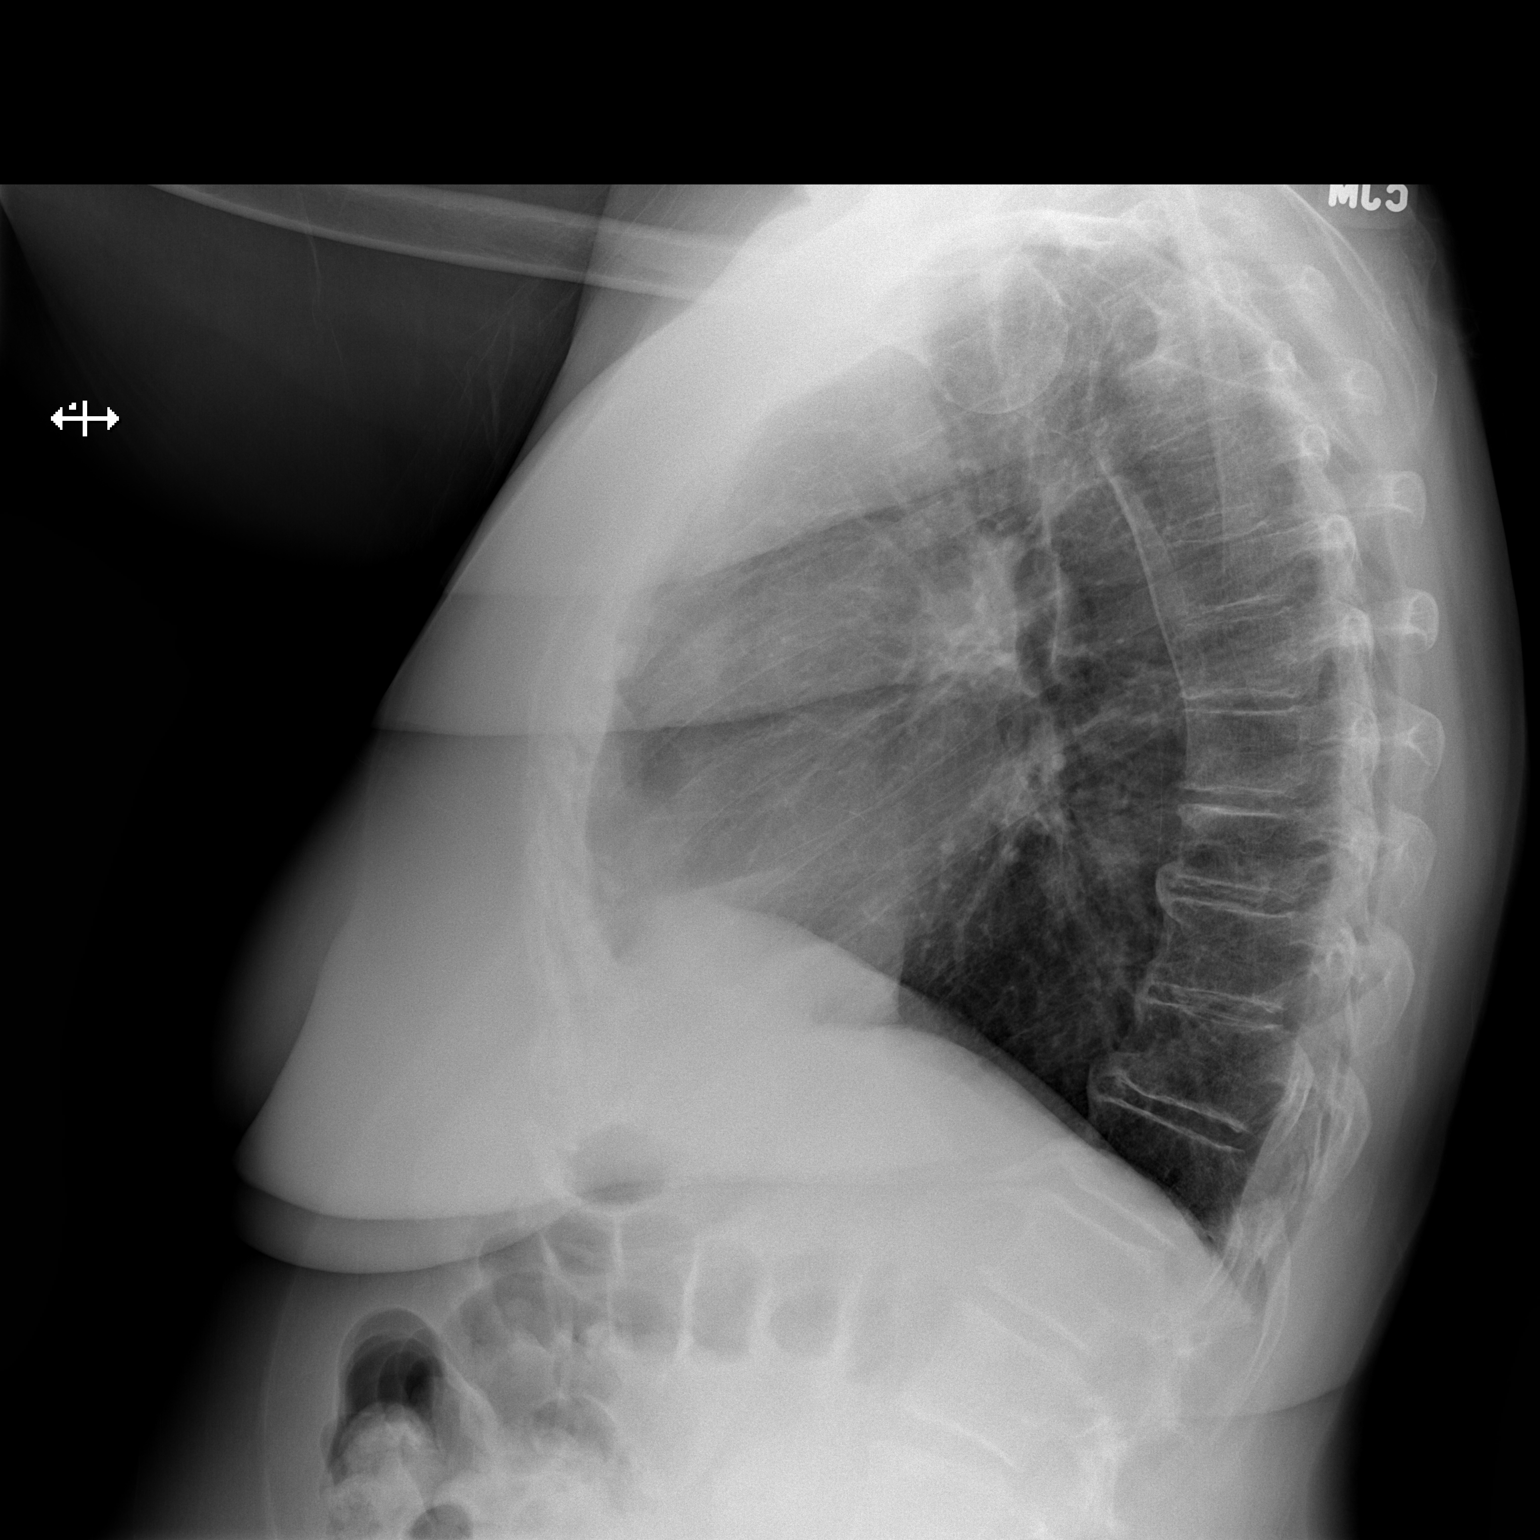

[2 of 2 positions shown; findings below may reference images not displayed]

FINDINGS: Cardiomediastinal silhouette appears normal.  No acute
pulmonary disease is noted.  Bony thorax is intact.
IMPRESSION: No acute cardiopulmonary abnormality seen.

## 2014-07-26 ENCOUNTER — Telehealth: Payer: Self-pay

## 2014-07-26 NOTE — Telephone Encounter (Signed)
Left message for patient to get her flu shot.

## 2014-12-29 ENCOUNTER — Encounter: Payer: Self-pay | Admitting: *Deleted

## 2015-02-07 ENCOUNTER — Other Ambulatory Visit: Payer: Self-pay

## 2015-08-16 DIAGNOSIS — M5416 Radiculopathy, lumbar region: Secondary | ICD-10-CM | POA: Diagnosis not present

## 2015-08-29 DIAGNOSIS — M5416 Radiculopathy, lumbar region: Secondary | ICD-10-CM | POA: Diagnosis not present

## 2015-09-13 DIAGNOSIS — M5416 Radiculopathy, lumbar region: Secondary | ICD-10-CM | POA: Diagnosis not present

## 2015-09-20 ENCOUNTER — Ambulatory Visit (INDEPENDENT_AMBULATORY_CARE_PROVIDER_SITE_OTHER): Payer: Medicare Other | Admitting: Family Medicine

## 2015-09-20 VITALS — BP 158/84 | HR 58 | Temp 98.0°F | Resp 16 | Ht 65.0 in | Wt 192.6 lb

## 2015-09-20 DIAGNOSIS — J060 Acute laryngopharyngitis: Secondary | ICD-10-CM | POA: Diagnosis not present

## 2015-09-20 DIAGNOSIS — I10 Essential (primary) hypertension: Secondary | ICD-10-CM

## 2015-09-20 MED ORDER — LISINOPRIL-HYDROCHLOROTHIAZIDE 10-12.5 MG PO TABS: 1.0000 | ORAL_TABLET | Freq: Every day | ORAL | Status: DC

## 2015-09-20 NOTE — Patient Instructions (Signed)
  Sore Throat A sore throat is pain, burning, irritation, or scratchiness of the throat. There is often pain or tenderness when swallowing or talking. A sore throat may be accompanied by other symptoms, such as coughing, sneezing, fever, and swollen neck glands. A sore throat is often the first sign of another sickness, such as a cold, flu, strep throat, or mononucleosis (commonly known as mono). Most sore throats go away without medical treatment. CAUSES  The most common causes of a sore throat include:  A viral infection, such as a cold, flu, or mono.  A bacterial infection, such as strep throat, tonsillitis, or whooping cough.  Seasonal allergies.  Dryness in the air.  Irritants, such as smoke or pollution.  Gastroesophageal reflux disease (GERD). HOME CARE INSTRUCTIONS   Only take over-the-counter medicines as directed by your caregiver.  Drink enough fluids to keep your urine clear or pale yellow.  Rest as needed.  Try using throat sprays, lozenges, or sucking on hard candy to ease any pain (if older than 4 years or as directed).  Ibuprofen 800 mg every 6-8 hours.    Sip warm liquids, such as broth, herbal tea, or warm water with honey to relieve pain temporarily. You may also eat or drink cold or frozen liquids such as frozen ice pops.  Gargle with salt water (mix 1 tsp salt with 8 oz of water).  Do not smoke and avoid secondhand smoke.  Put a cool-mist humidifier in your bedroom at night to moisten the air. You can also turn on a hot shower and sit in the bathroom with the door closed for 5-10 minutes. SEEK IMMEDIATE MEDICAL CARE IF:  You have difficulty breathing.  You are unable to swallow fluids, soft foods, or your saliva.  You have increased swelling in the throat.  Your sore throat does not get better in 7 days.  You have nausea and vomiting.  You have a fever or persistent symptoms for more than 2-3 days.  You have a fever and your symptoms suddenly get  worse. MAKE SURE YOU:   Understand these instructions.  Will watch your condition.  Will get help right away if you are not doing well or get worse.   This information is not intended to replace advice given to you by your health care provider. Make sure you discuss any questions you have with your health care provider.   Document Released: 09/06/2004 Document Revised: 08/20/2014 Document Reviewed: 04/06/2012 Elsevier Interactive Patient Education [employer].

## 2015-09-20 NOTE — Progress Notes (Signed)
  Kelli Swanson is a 70 y.o. female presenting with a sore throat for 4 days.  Associated symptoms include:  sinus pressure.  Symptoms are constant. Also c/o HA, chills, but denies fever, sweats.   Home treatment thus far includes:  Benadryl PRN which does help.  No known sick contacts with similar symptoms.  Did not receive influenza shot this yr   There is no history of of similar symptoms.  Exam:  BP 158/84 mmHg  Pulse 58  Temp(Src) 98 F (36.7 C) (Oral)  Resp 16  Ht 5\' 5"  (1.651 m)  Wt 192 lb 9.6 oz (87.363 kg)  BMI 32.05 kg/m2  SpO2 98% Constitutional : NAD HEENT : [place]/AT, MMM, O/P clear Neck no LAD  Heart : RRR Lungs : CTAB Skin : No rashes   Assessment/Plan 1) Pharyngitis- Most likely viral in etiology.  Sx management given to pt in detail.   2) HTN - most likely secondary to injection tx over the last week by her spine surgeon.  Currently on Lisinopril/HCTZ w/o any Sx.  Continue with current medication and if start having any Sx, told to call but expect this to resolve.

## 2015-09-28 DIAGNOSIS — M5416 Radiculopathy, lumbar region: Secondary | ICD-10-CM | POA: Diagnosis not present

## 2015-09-28 DIAGNOSIS — M545 Low back pain: Secondary | ICD-10-CM | POA: Diagnosis not present

## 2015-10-03 DIAGNOSIS — M5416 Radiculopathy, lumbar region: Secondary | ICD-10-CM | POA: Diagnosis not present

## 2015-10-21 DIAGNOSIS — R5383 Other fatigue: Secondary | ICD-10-CM | POA: Diagnosis not present

## 2015-10-21 DIAGNOSIS — M810 Age-related osteoporosis without current pathological fracture: Secondary | ICD-10-CM | POA: Diagnosis not present

## 2015-10-21 DIAGNOSIS — E559 Vitamin D deficiency, unspecified: Secondary | ICD-10-CM | POA: Diagnosis not present

## 2015-10-28 DIAGNOSIS — E559 Vitamin D deficiency, unspecified: Secondary | ICD-10-CM | POA: Diagnosis not present

## 2015-10-28 DIAGNOSIS — M810 Age-related osteoporosis without current pathological fracture: Secondary | ICD-10-CM | POA: Diagnosis not present

## 2015-11-08 DIAGNOSIS — M5416 Radiculopathy, lumbar region: Secondary | ICD-10-CM | POA: Diagnosis not present

## 2015-11-09 DIAGNOSIS — H25013 Cortical age-related cataract, bilateral: Secondary | ICD-10-CM | POA: Diagnosis not present

## 2015-11-09 DIAGNOSIS — H2513 Age-related nuclear cataract, bilateral: Secondary | ICD-10-CM | POA: Diagnosis not present

## 2015-12-21 ENCOUNTER — Ambulatory Visit (INDEPENDENT_AMBULATORY_CARE_PROVIDER_SITE_OTHER): Payer: Medicare Other | Admitting: Family Medicine

## 2015-12-21 ENCOUNTER — Telehealth: Payer: Self-pay | Admitting: Family Medicine

## 2015-12-21 VITALS — BP 128/76 | HR 64 | Temp 98.0°F | Resp 18 | Ht 65.0 in | Wt 186.0 lb

## 2015-12-21 DIAGNOSIS — Z0000 Encounter for general adult medical examination without abnormal findings: Secondary | ICD-10-CM

## 2015-12-21 DIAGNOSIS — E785 Hyperlipidemia, unspecified: Secondary | ICD-10-CM | POA: Diagnosis not present

## 2015-12-21 DIAGNOSIS — I10 Essential (primary) hypertension: Secondary | ICD-10-CM | POA: Diagnosis not present

## 2015-12-21 LAB — LIPID PANEL
CHOLESTEROL: 221 mg/dL — AB (ref 125–200)
HDL: 74 mg/dL (ref >=46)
LDL Cholesterol: 130 mg/dL — ABNORMAL HIGH (ref <130)
Total CHOL/HDL Ratio: 3 Ratio (ref <=5.0)
Triglycerides: 87 mg/dL (ref <150)
VLDL: 17 mg/dL (ref <30)

## 2015-12-21 MED ORDER — ATORVASTATIN CALCIUM 20 MG PO TABS: 20.0000 mg | ORAL_TABLET | Freq: Every day | ORAL | Status: DC

## 2015-12-21 MED ORDER — LISINOPRIL-HYDROCHLOROTHIAZIDE 10-12.5 MG PO TABS: 1.0000 | ORAL_TABLET | Freq: Every day | ORAL | Status: DC

## 2015-12-21 NOTE — Telephone Encounter (Signed)
Called and discussed HLD.,   13.7% or 7.7% 10 year ascvd risk. Recommend statin, she is happy to try the medication.   Recommend follow up in 3 months for repeat CMP and lipid panel,.   Lipitor 20 mg  Laroy Apple, MD  12/21/2015, 4:23 PM
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
## 2015-12-21 NOTE — Patient Instructions (Addendum)
  Great to meet you!  We will send a letter with results within 1 week.   If you need it I will send a cholesterol medicine to your pharmacy, I like to follow up in 3 months after starting cholesterol medicines for labs and to see how its going.      IF you received an x-ray today, you will receive an invoice from [facility]. Please contact [facility] at [phone] with questions or concerns regarding your invoice.   IF you received labwork today, you will receive an invoice from [employer]. Please contact [facility] at [phone] with questions or concerns regarding your invoice.   Our billing staff will not be able to assist you with questions regarding bills from these companies.  You will be contacted with the lab results as soon as they are available. The fastest way to get your results is to activate your My Chart account. Instructions are located on the last page of this paperwork. If you have not heard from [place] regarding the results in 2 weeks, please contact this office.   We recommend that you schedule a mammogram for breast cancer screening. Typically, you do not need a referral to do this. Please contact a local imaging center to schedule your mammogram.  [facility] - [phone]  *ask for the Radiology Department [facility]) - [phone] or [phone]  [facility] - [phone] [place] [phone] [facility] - [phone]  *ask for the [facility] - [phone]  *ask for the Radiology Department [facility] - [phone]  *ask for the [place] - [phone]

## 2015-12-28 ENCOUNTER — Telehealth: Payer: Self-pay | Admitting: Family Medicine

## 2015-12-28 NOTE — Telephone Encounter (Signed)
Please call her and tell her there are several options- 1- carefully watch diet (decrease sugars, starches and fats) and recheck in 6 months, 2- try to take 1/2 tablet daily and see if cramps improve, 3- take a whole tablet 2-3 times a week instead of every day. Unfortunately, it is likely that all of the medications in the cholesterol lowering category will cause leg cramps.

## 2015-12-28 NOTE — Telephone Encounter (Signed)
Spoke with pt, she is having leg cramps due to the Lipitor. She would like to switch to something else. Please advise.

## 2015-12-28 NOTE — Telephone Encounter (Signed)
Patient stated she is having leg and foot pain. She is having cramps. Patient want to know if she need to take a medication.

## 2015-12-28 NOTE — Telephone Encounter (Signed)
Which medication ?

## 2015-12-29 NOTE — Telephone Encounter (Signed)
Left message for pt to call back  °

## 2015-12-30 NOTE — Telephone Encounter (Signed)
SPoke with pt, advised message she will try 1/2 pill every other day.

## 2016-01-17 DIAGNOSIS — L820 Inflamed seborrheic keratosis: Secondary | ICD-10-CM | POA: Diagnosis not present

## 2016-01-17 DIAGNOSIS — L570 Actinic keratosis: Secondary | ICD-10-CM | POA: Diagnosis not present

## 2016-03-27 DIAGNOSIS — M47816 Spondylosis without myelopathy or radiculopathy, lumbar region: Secondary | ICD-10-CM | POA: Diagnosis not present

## 2016-04-18 DIAGNOSIS — M810 Age-related osteoporosis without current pathological fracture: Secondary | ICD-10-CM | POA: Diagnosis not present

## 2016-04-18 DIAGNOSIS — R5383 Other fatigue: Secondary | ICD-10-CM | POA: Diagnosis not present

## 2016-04-18 DIAGNOSIS — E559 Vitamin D deficiency, unspecified: Secondary | ICD-10-CM | POA: Diagnosis not present

## 2016-04-26 DIAGNOSIS — M810 Age-related osteoporosis without current pathological fracture: Secondary | ICD-10-CM | POA: Diagnosis not present

## 2016-05-01 DIAGNOSIS — M545 Low back pain: Secondary | ICD-10-CM | POA: Diagnosis not present

## 2016-05-02 DIAGNOSIS — M810 Age-related osteoporosis without current pathological fracture: Secondary | ICD-10-CM | POA: Diagnosis not present

## 2016-05-02 DIAGNOSIS — E559 Vitamin D deficiency, unspecified: Secondary | ICD-10-CM | POA: Diagnosis not present

## 2016-06-05 ENCOUNTER — Ambulatory Visit (INDEPENDENT_AMBULATORY_CARE_PROVIDER_SITE_OTHER): Payer: Medicare Other | Admitting: Family Medicine

## 2016-06-05 ENCOUNTER — Encounter: Payer: Self-pay | Admitting: Family Medicine

## 2016-06-05 VITALS — BP 128/82 | HR 71 | Temp 98.4°F | Resp 17 | Ht 64.5 in | Wt 178.0 lb

## 2016-06-05 DIAGNOSIS — Z23 Encounter for immunization: Secondary | ICD-10-CM

## 2016-06-05 DIAGNOSIS — E782 Mixed hyperlipidemia: Secondary | ICD-10-CM | POA: Diagnosis not present

## 2016-06-05 DIAGNOSIS — J069 Acute upper respiratory infection, unspecified: Secondary | ICD-10-CM

## 2016-06-05 MED ORDER — AZITHROMYCIN 250 MG PO TABS: ORAL_TABLET | ORAL | 0 refills | Status: DC
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
## 2016-06-05 NOTE — Patient Instructions (Signed)
     IF you received an x-ray today, you will receive an invoice from [facility]. Please contact [facility] at [phone] with questions or concerns regarding your invoice.   IF you received labwork today, you will receive an invoice from [facility]. Please contact [facility] at [phone] with questions or concerns regarding your invoice.   Our billing staff will not be able to assist you with questions regarding bills from these companies.  You will be contacted with the lab results as soon as they are available. The fastest way to get your results is to activate your My Chart account. Instructions are located on the last page of this paperwork. If you have not heard from us regarding the results in 2 weeks, please contact this office.      

## 2016-06-15 DIAGNOSIS — Z1231 Encounter for screening mammogram for malignant neoplasm of breast: Secondary | ICD-10-CM | POA: Diagnosis not present

## 2016-06-15 LAB — HM MAMMOGRAPHY: HM MAMMO: NORMAL (ref 0–4)

## 2016-06-18 DIAGNOSIS — L309 Dermatitis, unspecified: Secondary | ICD-10-CM | POA: Diagnosis not present

## 2016-06-27 ENCOUNTER — Encounter: Payer: Self-pay | Admitting: *Deleted

## 2016-07-20 DIAGNOSIS — H524 Presbyopia: Secondary | ICD-10-CM | POA: Diagnosis not present

## 2016-07-20 DIAGNOSIS — H5203 Hypermetropia, bilateral: Secondary | ICD-10-CM | POA: Diagnosis not present

## 2016-07-20 DIAGNOSIS — H25013 Cortical age-related cataract, bilateral: Secondary | ICD-10-CM | POA: Diagnosis not present

## 2016-07-20 DIAGNOSIS — H2513 Age-related nuclear cataract, bilateral: Secondary | ICD-10-CM | POA: Diagnosis not present

## 2016-07-20 DIAGNOSIS — H52222 Regular astigmatism, left eye: Secondary | ICD-10-CM | POA: Diagnosis not present

## 2016-07-25 ENCOUNTER — Ambulatory Visit (INDEPENDENT_AMBULATORY_CARE_PROVIDER_SITE_OTHER): Payer: Medicare Other | Admitting: Family Medicine

## 2016-07-25 ENCOUNTER — Encounter: Payer: Self-pay | Admitting: Family Medicine

## 2016-07-25 VITALS — BP 126/72 | HR 75 | Temp 98.1°F | Resp 16 | Wt 177.0 lb

## 2016-07-25 DIAGNOSIS — M545 Low back pain: Secondary | ICD-10-CM

## 2016-07-25 DIAGNOSIS — G479 Sleep disorder, unspecified: Secondary | ICD-10-CM

## 2016-07-25 DIAGNOSIS — G8929 Other chronic pain: Secondary | ICD-10-CM

## 2016-07-25 DIAGNOSIS — I10 Essential (primary) hypertension: Secondary | ICD-10-CM | POA: Diagnosis not present

## 2016-07-25 MED ORDER — LISINOPRIL-HYDROCHLOROTHIAZIDE 10-12.5 MG PO TABS: 1.0000 | ORAL_TABLET | Freq: Every day | ORAL | 3 refills | Status: DC
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
## 2016-07-25 NOTE — Patient Instructions (Addendum)
IF you received an x-ray today, you will receive an invoice from [facility]. Please contact [facility] at [phone] with questions or concerns regarding your invoice.   IF you received labwork today, you will receive an invoice from [employer]. Please contact [facility] at [phone] with questions or concerns regarding your invoice.   Our billing staff will not be able to assist you with questions regarding bills from these companies.  You will be contacted with the lab results as soon as they are available. The fastest way to get your results is to activate your My Chart account. Instructions are located on the last page of this paperwork. If you have not heard from [place] regarding the results in 2 weeks, please contact this office.     Insomnia Insomnia is a sleep disorder that makes it difficult to fall asleep or to stay asleep. Insomnia can cause tiredness (fatigue), low energy, difficulty concentrating, mood swings, and poor performance at work or school. There are three different ways to classify insomnia:  Difficulty falling asleep.  Difficulty staying asleep.  Waking up too early in the morning. Any type of insomnia can be long-term (chronic) or short-term (acute). Both are common. Short-term insomnia usually lasts for three months or less. Chronic insomnia occurs at least three times a week for longer than three months. What are the causes? Insomnia may be caused by another condition, situation, or substance, such as:  Anxiety.  Certain medicines.  Gastroesophageal reflux disease (GERD) or other gastrointestinal conditions.  Asthma or other breathing conditions.  Restless legs syndrome, sleep apnea, or other sleep disorders.  Chronic pain.  Menopause. This may include hot flashes.  Stroke.  Abuse of alcohol, tobacco, or illegal drugs.  Depression.  Caffeine.  Neurological disorders, such as Alzheimer  disease.  An overactive thyroid (hyperthyroidism). The cause of insomnia may not be known. What increases the risk? Risk factors for insomnia include:  Gender. Women are more commonly affected than men.  Age. Insomnia is more common as you get older.  Stress. This may involve your professional or personal life.  Income. Insomnia is more common in people with lower income.  Lack of exercise.  Irregular work schedule or night shifts.  Traveling between different time zones. What are the signs or symptoms? If you have insomnia, trouble falling asleep or trouble staying asleep is the main symptom. This may lead to other symptoms, such as:  Feeling fatigued.  Feeling nervous about going to sleep.  Not feeling rested in the morning.  Having trouble concentrating.  Feeling irritable, anxious, or depressed. How is this treated? Treatment for insomnia depends on the cause. If your insomnia is caused by an underlying condition, treatment will focus on addressing the condition. Treatment may also include:  Medicines to help you sleep.  Counseling or therapy.  Lifestyle adjustments. Follow these instructions at home:  Take medicines only as directed by your health care provider.  Keep regular sleeping and waking hours. Avoid naps.  Keep a sleep diary to help you and your health care provider figure out what could be causing your insomnia. Include:  When you sleep.  When you wake up during the night.  How well you sleep.  How rested you feel the next day.  Any side effects of medicines you are taking.  What you eat and drink.  Make your bedroom a comfortable place where it is easy to fall asleep:  Put up shades or special blackout curtains  to block light from outside.  Use a white noise machine to block noise.  Keep the temperature cool.  Exercise regularly as directed by your health care provider. Avoid exercising right before bedtime.  Use relaxation  techniques to manage stress. Ask your health care provider to suggest some techniques that may work well for you. These may include:  Breathing exercises.  Routines to release muscle tension.  Visualizing peaceful scenes.  Cut back on alcohol, caffeinated beverages, and cigarettes, especially close to bedtime. These can disrupt your sleep.  Do not overeat or eat spicy foods right before bedtime. This can lead to digestive discomfort that can make it hard for you to sleep.  Limit screen use before bedtime. This includes:  Watching TV.  Using your smartphone, tablet, and computer.  Stick to a routine. This can help you fall asleep faster. Try to do a quiet activity, brush your teeth, and go to bed at the same time each night.  Get out of bed if you are still awake after 15 minutes of trying to sleep. Keep the lights down, but try reading or doing a quiet activity. When you feel sleepy, go back to bed.  Make sure that you drive carefully. Avoid driving if you feel very sleepy.  Keep all follow-up appointments as directed by your health care provider. This is important. Contact a health care provider if:  You are tired throughout the day or have trouble in your daily routine due to sleepiness.  You continue to have sleep problems or your sleep problems get worse. Get help right away if:  You have serious thoughts about hurting yourself or someone else. This information is not intended to replace advice given to you by your health care provider. Make sure you discuss any questions you have with your health care provider. Document Released: 07/27/2000 Document Revised: 12/30/2015 Document Reviewed: 04/30/2014 Elsevier Interactive Patient Education  2017 [employer].

## 2016-08-22 ENCOUNTER — Encounter: Payer: Self-pay | Admitting: Family Medicine

## 2016-08-22 ENCOUNTER — Ambulatory Visit (INDEPENDENT_AMBULATORY_CARE_PROVIDER_SITE_OTHER): Payer: Medicare Other | Admitting: Family Medicine

## 2016-08-22 VITALS — BP 130/60 | HR 80 | Temp 99.4°F | Resp 16 | Ht 65.0 in | Wt 180.2 lb

## 2016-08-22 DIAGNOSIS — J069 Acute upper respiratory infection, unspecified: Secondary | ICD-10-CM | POA: Diagnosis not present
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
## 2016-08-22 NOTE — Patient Instructions (Addendum)
   IF you received an x-ray today, you will receive an invoice from [facility]. Please contact  Radiology at [phone] with questions or concerns regarding your invoice.   IF you received labwork today, you will receive an invoice from [facility]. Please contact [facility] at [phone] with questions or concerns regarding your invoice.   Our billing staff will not be able to assist you with questions regarding bills from these companies.  You will be contacted with the lab results as soon as they are available. The fastest way to get your results is to activate your My Chart account. Instructions are located on the last page of this paperwork. If you have not heard from us regarding the results in 2 weeks, please contact this office.     Viral Respiratory Infection A respiratory infection is an illness that affects part of the respiratory system, such as the lungs, nose, or throat. Most respiratory infections are caused by either viruses or bacteria. A respiratory infection that is caused by a virus is called a viral respiratory infection. Common types of viral respiratory infections include:  A cold.  The flu (influenza).  A respiratory syncytial virus (RSV) infection.  How do I know if I have a viral respiratory infection? Most viral respiratory infections cause:  A stuffy or runny nose.  Yellow or green nasal discharge.  A cough.  Sneezing.  Fatigue.  Achy muscles.  A sore throat.  Sweating or chills.  A fever.  A headache.  How are viral respiratory infections treated? If influenza is diagnosed early, it may be treated with an antiviral medicine that shortens the length of time a person has symptoms. Symptoms of viral respiratory infections may be treated with over-the-counter and prescription medicines, such as:  Expectorants. These make it easier to cough up mucus.  Decongestant nasal sprays.  Health care providers do not prescribe  antibiotic medicines for viral infections. This is because antibiotics are designed to kill bacteria. They have no effect on viruses. How do I know if I should stay home from work or school? To avoid exposing others to your respiratory infection, stay home if you have:  A fever.  A persistent cough.  A sore throat.  A runny nose.  Sneezing.  Muscles aches.  Headaches.  Fatigue.  Weakness.  Chills.  Sweating.  Nausea.  Follow these instructions at home:  Rest as much as possible.  Take over-the-counter and prescription medicines only as told by your health care provider.  Drink enough fluid to keep your urine clear or pale yellow. This helps prevent dehydration and helps loosen up mucus.  Gargle with a salt-water mixture 3-4 times per day or as needed. To make a salt-water mixture, completely dissolve -1 tsp of salt in 1 cup of warm water.  Use nose drops made from salt water to ease congestion and soften raw skin around your nose.  Do not drink alcohol.  Do not use tobacco products, including cigarettes, chewing tobacco, and e-cigarettes. If you need help quitting, ask your health care provider. Contact a health care provider if:  Your symptoms last for 10 days or longer.  Your symptoms get worse over time.  You have a fever.  You have severe sinus pain in your face or forehead.  The glands in your jaw or neck become very swollen. Get help right away if:  You feel pain or pressure in your chest.  You have shortness of breath.  You faint or feel like you   will faint.  You have severe and persistent vomiting.  You feel confused or disoriented. This information is not intended to replace advice given to you by your health care provider. Make sure you discuss any questions you have with your health care provider. Document Released: 05/09/2005 Document Revised: 01/05/2016 Document Reviewed: 01/05/2015 Elsevier Interactive Patient Education  2017 Elsevier  Inc.  

## 2016-08-27 ENCOUNTER — Ambulatory Visit (INDEPENDENT_AMBULATORY_CARE_PROVIDER_SITE_OTHER): Payer: Medicare Other

## 2016-08-27 ENCOUNTER — Ambulatory Visit (INDEPENDENT_AMBULATORY_CARE_PROVIDER_SITE_OTHER): Payer: Medicare Other | Admitting: Physician Assistant

## 2016-08-27 VITALS — BP 138/62 | HR 82 | Temp 98.5°F | Ht 65.0 in | Wt 174.2 lb

## 2016-08-27 DIAGNOSIS — R05 Cough: Secondary | ICD-10-CM

## 2016-08-27 DIAGNOSIS — R059 Cough, unspecified: Secondary | ICD-10-CM

## 2016-08-27 MED ORDER — BENZONATATE 100 MG PO CAPS: 100.0000 mg | ORAL_CAPSULE | Freq: Three times a day (TID) | ORAL | 0 refills | Status: DC | PRN

## 2016-08-27 NOTE — Progress Notes (Signed)
Patient ID: Kelli Swanson, female    DOB: Mar 15, 1946, 71 y.o.   MRN: [id number]  PCP: Forrest Moron, MD  Chief Complaint  Patient presents with  . Nasal Congestion    X 4 days   . Cough    X 4 days with bodyaches    Subjective:   Presents for evaluation of cough and nasal congestion.  Pt is a 71yo caucasian female who presents with cough and congestion. Pt states that she was seen by Dr. Nolon Rod on Wednesday (1/10) and was diagnosed at that time with a viral upper respiratory infection and was recommended to drink plenty of water and take OTC cough/cold medications. She states that she has worsening cough and fatigue over the weekend and noticed blood in her sputum a few times. Symptoms are worse at night and coughing keeps her up. She states that today is the first day she is feeling better. She has tried OTC Muciniex and tylenol sinus with minimal relief. She admits to fever, fatigue, headaches, body aches, sore throat, and nasal congestion.   Review of Systems In addition to that stated above in HPI: Const: Denies wt changes. HEENT: Denies ear pain, sinus pressure,  Pulm: Denies SOB. CV: Denies chest pain or palpations.  Abd: Denies abdominal pain, nausea, vomiting, diarrhea, or constipation.  Neuro: Denies photophobia or phonophobia.  Patient Active Problem List   Diagnosis Date Noted  . HLD (hyperlipidemia) 12/21/2015  . Healthcare maintenance 12/21/2015  . Osteoarthritis of right knee, retained hardware 03/22/2013  . HTN (hypertension) 10/27/2011     Prior to Admission medications   Medication Sig Start Date End Date Taking? Authorizing Provider  aspirin EC 325 MG tablet Take 1 tablet (325 mg total) by mouth 2 (two) times daily. 03/25/13  Yes Leighton Parody, PA-C  atorvastatin (LIPITOR) 20 MG tablet Take 1 tablet (20 mg total) by mouth daily. 12/21/15  Yes Timmothy Euler, MD  Calcium Carbonate-Vitamin D (CALCIUM 600 + D PO) Take 1 tablet by mouth 2 (two)  times daily.   Yes Historical Provider, MD  denosumab (PROLIA) 60 MG/ML SOLN injection Inject 60 mg into the skin every 6 (six) months. Administer in upper arm, thigh, or abdomen   Yes Historical Provider, MD  diphenhydrAMINE (BENADRYL) 25 MG tablet Take 25 mg by mouth every 6 (six) hours as needed for itching.   Yes Historical Provider, MD  gabapentin (NEURONTIN) 300 MG capsule Take 300 mg by mouth 3 (three) times daily.   Yes Historical Provider, MD  HYDROcodone-acetaminophen (NORCO/VICODIN) 5-325 MG per tablet Take 1 tablet by mouth every 6 (six) hours as needed for moderate pain.   Yes Historical Provider, MD  lisinopril-hydrochlorothiazide (PRINZIDE,ZESTORETIC) 10-12.5 MG tablet Take 1 tablet by mouth daily before breakfast. 07/25/16  Yes Zoe A Nolon Rod, MD  Multiple Vitamins-Minerals (CENTRUM SILVER) tablet Take 1 tablet by mouth daily.   Yes Historical Provider, MD     Allergies  Allergen Reactions  . Lincocin [Lincomycin Hcl] Anaphylaxis  . Penicillins Anaphylaxis  . Codeine Nausea Only  . Levofloxacin Other (See Comments)    "Extreme muscle pain and soreness in the calves of the legs."  . Meloxicam Swelling       Objective:  Physical Exam HEENT: Throat is mildly erythematous, no exudates, no tonsillar hypertrophy. Ear canals clear bilaterally, TM's intact, non bulging; Nasal turbinates nonerythematous bilaterally; No tenderness to palpation of maxillary or frontal sinuses. Pulm: Good respiratory effort. CTAB No Wheezes, rales, or rhonchi.  No tenderness to palpation of the chest wall anteriorly or posteriorly.  CV: RRR. No M/R/G.  Abd: Nontender to palpation. No masses noted.     Assessment & Plan:   1. Cough Pt advised to drink plenty of water and that a cough can last 4-6 weeks after URI. Pt advised to return if symptoms persist or if new or worrisome symptoms arise. - DG Chest 2 View; Future - benzonatate (TESSALON) 100 MG capsule; Take 1-2 capsules (100-200 mg total) by  mouth 3 (three) times daily as needed for cough.  Dispense: 40 capsule; Refill: 0  Lorella Nimrod, PA-S

## 2016-08-27 NOTE — Progress Notes (Signed)
Patient ID: Kelli Swanson, female    DOB: [birth year], 71 y.o.   MRN: [id number]  PCP: Forrest Moron, MD  Chief Complaint  Patient presents with  . Nasal Congestion    X 4 days   . Cough    X 4 days with bodyaches    Subjective:   Presents for evaluation of persistent illness.  She was seen here by her PCP on 08/22/2016 with cough, sore throat with coughing, body aches and headache. Her symptoms were thought likely due to viral URI and she was advised to monitor for worsening symptoms and use supportive care.  She spent several days resting, very out of the ordinary for her, and 2 days ago noted some blood tingle to the sputum she was producing. She planned to RTC yesterday, but forgot that we are no longer open on Sundays.  No SOB, CP. No fever, chills. No GI/GU symptoms.     Review of Systems  As above.   Patient Active Problem List   Diagnosis Date Noted  . HLD (hyperlipidemia) 12/21/2015  . Healthcare maintenance 12/21/2015  . Osteoarthritis of right knee, retained hardware 03/22/2013  . HTN (hypertension) 10/27/2011     Prior to Admission medications   Medication Sig Start Date End Date Taking? Authorizing Provider  aspirin EC 325 MG tablet Take 1 tablet (325 mg total) by mouth 2 (two) times daily. 03/25/13  Yes Leighton Parody, PA-C  atorvastatin (LIPITOR) 20 MG tablet Take 1 tablet (20 mg total) by mouth daily. 12/21/15  Yes Timmothy Euler, MD  Calcium Carbonate-Vitamin D (CALCIUM 600 + D PO) Take 1 tablet by mouth 2 (two) times daily.   Yes Historical Provider, MD  denosumab (PROLIA) 60 MG/ML SOLN injection Inject 60 mg into the skin every 6 (six) months. Administer in upper arm, thigh, or abdomen   Yes Historical Provider, MD  diphenhydrAMINE (BENADRYL) 25 MG tablet Take 25 mg by mouth every 6 (six) hours as needed for itching.   Yes Historical Provider, MD  gabapentin (NEURONTIN) 300 MG capsule Take 300 mg by mouth 3 (three) times daily.   Yes  Historical Provider, MD  HYDROcodone-acetaminophen (NORCO/VICODIN) 5-325 MG per tablet Take 1 tablet by mouth every 6 (six) hours as needed for moderate pain.   Yes Historical Provider, MD  lisinopril-hydrochlorothiazide (PRINZIDE,ZESTORETIC) 10-12.5 MG tablet Take 1 tablet by mouth daily before breakfast. 07/25/16  Yes Zoe A Nolon Rod, MD  Multiple Vitamins-Minerals (CENTRUM SILVER) tablet Take 1 tablet by mouth daily.   Yes Historical Provider, MD     Allergies  Allergen Reactions  . Lincocin [Lincomycin Hcl] Anaphylaxis  . Penicillins Anaphylaxis  . Codeine Nausea Only  . Levofloxacin Other (See Comments)    "Extreme muscle pain and soreness in the calves of the legs."  . Meloxicam Swelling       Objective:  Physical Exam  Constitutional: She is oriented to person, place, and time. She appears well-developed and well-nourished. She is active and cooperative. No distress.  BP 138/62 (BP Location: Right Arm, Patient Position: Sitting, Cuff Size: Small)   Pulse 82   Temp 98.5 F (36.9 C) (Oral)   Ht 5\' 5"  (1.651 m)   Wt 174 lb 3.2 oz (79 kg)   SpO2 98%   BMI 28.99 kg/m   HENT:  Head: Normocephalic and atraumatic.  Right Ear: Hearing, tympanic membrane, external ear and ear canal normal.  Left Ear: Hearing, tympanic membrane, external ear and ear canal normal.  Nose: Nose normal.  Mouth/Throat: Uvula is midline, oropharynx is clear and moist and mucous membranes are normal.  Eyes: Conjunctivae and EOM are normal. Pupils are equal, round, and reactive to light. No scleral icterus.  Neck: Normal range of motion. Neck supple. No thyromegaly present.  Cardiovascular: Normal rate, regular rhythm and normal heart sounds.   Pulses:      Radial pulses are 2+ on the right side, and 2+ on the left side.  Pulmonary/Chest: Effort normal and breath sounds normal.  Lymphadenopathy:       Head (right side): No tonsillar, no preauricular, no posterior auricular and no occipital adenopathy  present.       Head (left side): No tonsillar, no preauricular, no posterior auricular and no occipital adenopathy present.    She has no cervical adenopathy.       Right: No supraclavicular adenopathy present.       Left: No supraclavicular adenopathy present.  Neurological: She is alert and oriented to person, place, and time. No sensory deficit.  Skin: Skin is warm, dry and intact. No rash noted. No cyanosis or erythema. Nails show no clubbing.  Psychiatric: She has a normal mood and affect. Her speech is normal and behavior is normal.       Dg Chest 2 View  Result Date: 08/27/2016 CLINICAL DATA:  71 year old female with cough (not otherwise specified at the time of this report). EXAM: CHEST  2 VIEW COMPARISON:  03/13/2013 and earlier. FINDINGS: Stable lung volumes at the upper limits of normal to mildly hyperinflated. Mediastinal contours are stable and within normal limits. Mild chronic increased pulmonary interstitial markings. No pneumothorax, pulmonary edema, pleural effusion or confluent pulmonary opacity. Osteopenia. Partially visible chondroid appearing benign bone lesion of the proximal right humerus appears stable. No acute osseous abnormality identified. Negative visible bowel gas pattern. IMPRESSION: No acute cardiopulmonary abnormality. Electronically Signed   By: Genevie Ann M.D.   On: 08/27/2016 10:20       Assessment & Plan:   1. Cough Reassuring CXR. Suspect that she has had influenza. Continue supportive care. Add benzonatate. Anticipatory guidance provided. - DG Chest 2 View; Future - benzonatate (TESSALON) 100 MG capsule; Take 1-2 capsules (100-200 mg total) by mouth 3 (three) times daily as needed for cough.  Dispense: 40 capsule; Refill: 0   Fara Chute, PA-C Physician Assistant-Certified [facility] [place]

## 2016-08-27 NOTE — Patient Instructions (Addendum)
There is no pneumonia. It is common for cough to persist 4-6 weeks following this type of viral illness. Continue to rest and hydrate well.     IF you received an x-ray today, you will receive an invoice from [facility]. Please contact [facility] at [phone] with questions or concerns regarding your invoice.   IF you received labwork today, you will receive an invoice from [facility]. Please contact [facility] at [phone] with questions or concerns regarding your invoice.   Our billing staff will not be able to assist you with questions regarding bills from these companies.  You will be contacted with the lab results as soon as they are available. The fastest way to get your results is to activate your My Chart account. Instructions are located on the last page of this paperwork. If you have not heard from [place] regarding the results in 2 weeks, please contact this office.

## 2016-08-31 ENCOUNTER — Telehealth: Payer: Self-pay

## 2016-08-31 NOTE — Telephone Encounter (Signed)
Pt is still having a cough and coughing dark green mucus  And would like to know what else can be done   Best number [phone]

## 2016-09-01 ENCOUNTER — Ambulatory Visit (INDEPENDENT_AMBULATORY_CARE_PROVIDER_SITE_OTHER): Payer: Medicare Other | Admitting: Family Medicine

## 2016-09-01 DIAGNOSIS — J329 Chronic sinusitis, unspecified: Secondary | ICD-10-CM

## 2016-09-01 MED ORDER — AZITHROMYCIN 250 MG PO TABS: ORAL_TABLET | ORAL | 0 refills | Status: DC

## 2016-09-01 MED ORDER — BENZONATATE 100 MG PO CAPS
100.0000 mg | ORAL_CAPSULE | Freq: Three times a day (TID) | ORAL | 0 refills | Status: DC | PRN
Start: 2016-09-01 — End: 2016-10-31

## 2016-09-01 NOTE — Telephone Encounter (Signed)
Looks like dx with the flu 08/27/16. Any other suggestions? Run its course? See [place] if new fever or sob?

## 2016-09-01 NOTE — Progress Notes (Signed)
Patient ID: SULMA LAMARRE, female    DOB: [birth year], 71 y.o.   MRN: [id number]  PCP: Forrest Moron, MD  Chief Complaint  Patient presents with  . Cough    seen on 1/15 by Chelle & 1/10 by Dr. Nolon Rod "Feels like I am going backwards"  . Nasal Congestion  . Sinus Drainage  . Sinusitis    Subjective:  HPI  71 year old female presents for evaluation of persistent cough, nasal congestion, and sinus pressure nearing 2 weeks. Past medical hx includes hypertension, osteoarthritis and immunosuppressant therapy. Patient was seen here in clinic 1/10 and 1/15 for similar symptoms in which symptomatic treatment was prescribed. She presents today with a complaint of a persistent headache, muscle aches, thickened mucus, and a sore throat. Reports compared to her visit on 08/22/16, she feels significantly worst. Admits to compliance with all previous prescribed therapies, reports treatment did not improve symptoms. Denies fever. Reports fatigue.   Social History   Social History  . Marital status: Married    Spouse name: N/A  . Number of children: N/A  . Years of education: N/A   Occupational History  . Not on file.   Social History Main Topics  . Smoking status: Never Smoker  . Smokeless tobacco: Never Used  . Alcohol use No  . Drug use: No  . Sexual activity: Not on file   Other Topics Concern  . Not on file   Social History Narrative  . No narrative on file   No family history on file. Review of Systems See HPI Patient Active Problem List   Diagnosis Date Noted  . HLD (hyperlipidemia) 12/21/2015  . Healthcare maintenance 12/21/2015  . Osteoarthritis of right knee, retained hardware 03/22/2013  . HTN (hypertension) 10/27/2011    Allergies  Allergen Reactions  . Lincocin [Lincomycin Hcl] Anaphylaxis  . Penicillins Anaphylaxis  . Codeine Nausea Only  . Levofloxacin Other (See Comments)    "Extreme muscle pain and soreness in the calves of the legs."  .  Meloxicam Swelling    Prior to Admission medications   Medication Sig Start Date End Date Taking? Authorizing Provider  aspirin EC 325 MG tablet Take 1 tablet (325 mg total) by mouth 2 (two) times daily. 03/25/13  Yes Leighton Parody, PA-C  atorvastatin (LIPITOR) 20 MG tablet Take 1 tablet (20 mg total) by mouth daily. 12/21/15  Yes Timmothy Euler, MD  benzonatate (TESSALON) 100 MG capsule Take 1-2 capsules (100-200 mg total) by mouth 3 (three) times daily as needed for cough. 08/27/16  Yes Chelle Jeffery, PA-C  Calcium Carbonate-Vitamin D (CALCIUM 600 + D PO) Take 1 tablet by mouth 2 (two) times daily.   Yes Historical Provider, MD  denosumab (PROLIA) 60 MG/ML SOLN injection Inject 60 mg into the skin every 6 (six) months. Administer in upper arm, thigh, or abdomen   Yes Historical Provider, MD  diphenhydrAMINE (BENADRYL) 25 MG tablet Take 25 mg by mouth every 6 (six) hours as needed for itching.   Yes Historical Provider, MD  gabapentin (NEURONTIN) 300 MG capsule Take 300 mg by mouth 3 (three) times daily.   Yes Historical Provider, MD  HYDROcodone-acetaminophen (NORCO/VICODIN) 5-325 MG per tablet Take 1 tablet by mouth every 6 (six) hours as needed for moderate pain.   Yes Historical Provider, MD  lisinopril-hydrochlorothiazide (PRINZIDE,ZESTORETIC) 10-12.5 MG tablet Take 1 tablet by mouth daily before breakfast. 07/25/16  Yes Forrest Moron, MD  Multiple Vitamins-Minerals (CENTRUM SILVER) tablet Take 1  tablet by mouth daily.   Yes Historical Provider, MD    Past Medical, Surgical Family and Social History reviewed and updated.    Objective:   Today's Vitals   09/01/16 1348  BP: (!) 146/70  Pulse: 71  Resp: 16  Temp: 98.1 F (36.7 C)  TempSrc: Oral  SpO2: 97%  Weight: 176 lb 6 oz (80 kg)  Height: 5\' 5"  (1.651 m)    Wt Readings from Last 3 Encounters:  09/01/16 176 lb 6 oz (80 kg)  08/27/16 174 lb 3.2 oz (79 kg)  08/22/16 180 lb 3.2 oz (81.7 kg)   Physical Exam    Constitutional: She is oriented to person, place, and time. She appears well-developed and well-nourished.  HENT:  Head: Normocephalic.  Right Ear: Hearing, tympanic membrane, external ear and ear canal normal.  Left Ear: Tympanic membrane and external ear normal. Decreased hearing is noted.  Nose: Mucosal edema and rhinorrhea present. Right sinus exhibits frontal sinus tenderness. Left sinus exhibits frontal sinus tenderness.  Mouth/Throat: No oropharyngeal exudate, posterior oropharyngeal edema, posterior oropharyngeal erythema or tonsillar abscesses.  Eyes: Conjunctivae are normal. Pupils are equal, round, and reactive to light.  Neck: Normal range of motion. Neck supple.  Cardiovascular: Normal rate, regular rhythm, normal heart sounds and intact distal pulses.   Pulmonary/Chest: Effort normal and breath sounds normal.  Musculoskeletal: Normal range of motion.  Neurological: She is alert and oriented to person, place, and time.  Skin: Skin is warm and dry.  Psychiatric: She has a normal mood and affect. Her behavior is normal. Judgment and thought content normal.     Assessment & Plan:  1. Sinusitis, unspecified chronicity, unspecified location -Start Azithromycin Take 2 tabs x 1 dose, then 1 tab every day for x 4 days - Benzonatate (TESSALON) 100 MG capsule; Take 1-2 capsules up to 3 (three) times daily as needed for cough.   Return for follow-up as needed.  Carroll Sage. Kenton Kingfisher, MSN, FNP-C [facility]

## 2016-09-01 NOTE — Patient Instructions (Addendum)
Start Azithromycin Take 2 tabs x 1 dose, then 1 tab every day for x 4 days  Continue benzonatate 100-200 mg up to 3 times daily for cough.  If you experiencing any worsening of symptoms, please contact our office for further instruction.     IF you received an x-ray today, you will receive an invoice from [facility]. Please contact [facility] at [phone] with questions or concerns regarding your invoice.   IF you received labwork today, you will receive an invoice from [facility]. Please contact [facility] at [phone] with questions or concerns regarding your invoice.   Our billing staff will not be able to assist you with questions regarding bills from these companies.  You will be contacted with the lab results as soon as they are available. The fastest way to get your results is to activate your My Chart account. Instructions are located on the last page of this paperwork. If you have not heard from [place] regarding the results in 2 weeks, please contact this office.     Acute Bronchitis, Adult Acute bronchitis is when air tubes (bronchi) in the lungs suddenly get swollen. The condition can make it hard to breathe. It can also cause these symptoms:  A cough.  Coughing up clear, yellow, or green mucus.  Wheezing.  Chest congestion.  Shortness of breath.  A fever.  Body aches.  Chills.  A sore throat. Follow these instructions at home: Medicines  Take over-the-counter and prescription medicines only as told by your doctor.  If you were prescribed an antibiotic medicine, take it as told by your doctor. Do not stop taking the antibiotic even if you start to feel better. General instructions  Rest.  Drink enough fluids to keep your pee (urine) clear or pale yellow.  Avoid smoking and secondhand smoke. If you smoke and you need help quitting, ask your doctor. Quitting will help your lungs heal faster.  Use an inhaler, cool mist vaporizer, or  humidifier as told by your doctor.  Keep all follow-up visits as told by your doctor. This is important. How is this prevented? To lower your risk of getting this condition again:  Wash your hands often with soap and water. If you cannot use soap and water, use hand sanitizer.  Avoid contact with people who have cold symptoms.  Try not to touch your hands to your mouth, nose, or eyes.  Make sure to get the flu shot every year. Contact a doctor if:  Your symptoms do not get better in 2 weeks. Get help right away if:  You cough up blood.  You have chest pain.  You have very bad shortness of breath.  You become dehydrated.  You faint (pass out) or keep feeling like you are going to pass out.  You keep throwing up (vomiting).  You have a very bad headache.  Your fever or chills gets worse. This information is not intended to replace advice given to you by your health care provider. Make sure you discuss any questions you have with your health care provider. Document Released: 01/16/2008 Document Revised: 03/07/2016 Document Reviewed: 01/18/2016 Elsevier Interactive Patient Education  2017 [employer]  Sinusitis, Adult Sinusitis is soreness and inflammation of your sinuses. Sinuses are hollow spaces in the bones around your face. Your sinuses are located:  Around your eyes.  In the middle of your forehead.  Behind your nose.  In your cheekbones. Your sinuses and nasal passages are lined with a stringy fluid (mucus). Mucus  normally drains out of your sinuses. When your nasal tissues become inflamed or swollen, the mucus can become trapped or blocked so air cannot flow through your sinuses. This allows bacteria, viruses, and funguses to grow, which leads to infection. Sinusitis can develop quickly and last for 7?10 days (acute) or for more than 12 weeks (chronic). Sinusitis often develops after a cold. What are the causes? This condition is caused by anything that  creates swelling in the sinuses or stops mucus from draining, including:  Allergies.  Asthma.  Bacterial or viral infection.  Abnormally shaped bones between the nasal passages.  Nasal growths that contain mucus (nasal polyps).  Narrow sinus openings.  Pollutants, such as chemicals or irritants in the air.  A foreign object stuck in the nose.  A fungal infection. This is rare. What increases the risk? The following factors may make you more likely to develop this condition:  Having allergies or asthma.  Having had a recent cold or respiratory tract infection.  Having structural deformities or blockages in your nose or sinuses.  Having a weak immune system.  Doing a lot of swimming or diving.  Overusing nasal sprays.  Smoking. What are the signs or symptoms? The main symptoms of this condition are pain and a feeling of pressure around the affected sinuses. Other symptoms include:  Upper toothache.  Earache.  Headache.  Bad breath.  Decreased sense of smell and taste.  A cough that may get worse at night.  Fatigue.  Fever.  Thick drainage from your nose. The drainage is often green and it may contain pus (purulent).  Stuffy nose or congestion.  Postnasal drip. This is when extra mucus collects in the throat or back of the nose.  Swelling and warmth over the affected sinuses.  Sore throat.  Sensitivity to light. How is this diagnosed? This condition is diagnosed based on symptoms, a medical history, and a physical exam. To find out if your condition is acute or chronic, your health care provider may:  Look in your nose for signs of nasal polyps.  Tap over the affected sinus to check for signs of infection.  View the inside of your sinuses using an imaging device that has a light attached (endoscope). If your health care provider suspects that you have chronic sinusitis, you may also:  Be tested for allergies.  Have a sample of mucus taken from  your nose (nasal culture) and checked for bacteria.  Have a mucus sample examined to see if your sinusitis is related to an allergy. If your sinusitis does not respond to treatment and it lasts longer than 8 weeks, you may have an MRI or CT scan to check your sinuses. These scans also help to determine how severe your infection is. In rare cases, a bone biopsy may be done to rule out more serious types of fungal sinus disease. How is this treated? Treatment for sinusitis depends on the cause and whether your condition is chronic or acute. If a virus is causing your sinusitis, your symptoms will go away on their own within 10 days. You may be given medicines to relieve your symptoms, including:  Topical nasal decongestants. They shrink swollen nasal passages and let mucus drain from your sinuses.  Antihistamines. These drugs block inflammation that is triggered by allergies. This can help to ease swelling in your nose and sinuses.  Topical nasal corticosteroids. These are nasal sprays that ease inflammation and swelling in your nose and sinuses.  Nasal saline washes.  These rinses can help to get rid of thick mucus in your nose. If your condition is caused by bacteria, you will be given an antibiotic medicine. If your condition is caused by a fungus, you will be given an antifungal medicine. Surgery may be needed to correct underlying conditions, such as narrow nasal passages. Surgery may also be needed to remove polyps. Follow these instructions at home: Medicines  Take, use, or apply over-the-counter and prescription medicines only as told by your health care provider. These may include nasal sprays.  If you were prescribed an antibiotic medicine, take it as told by your health care provider. Do not stop taking the antibiotic even if you start to feel better. Hydrate and Humidify  Drink enough water to keep your urine clear or pale yellow. Staying hydrated will help to thin your  mucus.  Use a cool mist humidifier to keep the humidity level in your home above 50%.  Inhale steam for 10-15 minutes, 3-4 times a day or as told by your health care provider. You can do this in the bathroom while a hot shower is running.  Limit your exposure to cool or dry air. Rest  Rest as much as possible.  Sleep with your head raised (elevated).  Make sure to get enough sleep each night. General instructions  Apply a warm, moist washcloth to your face 3-4 times a day or as told by your health care provider. This will help with discomfort.  Wash your hands often with soap and water to reduce your exposure to viruses and other germs. If soap and water are not available, use hand sanitizer.  Do not smoke. Avoid being around people who are smoking (secondhand smoke).  Keep all follow-up visits as told by your health care provider. This is important. Contact a health care provider if:  You have a fever.  Your symptoms get worse.  Your symptoms do not improve within 10 days. Get help right away if:  You have a severe headache.  You have persistent vomiting.  You have pain or swelling around your face or eyes.  You have vision problems.  You develop confusion.  Your neck is stiff.  You have trouble breathing. This information is not intended to replace advice given to you by your health care provider. Make sure you discuss any questions you have with your health care provider. Document Released: 07/30/2005 Document Revised: 03/25/2016 Document Reviewed: 05/25/2015 Elsevier Interactive Patient Education  2017 [employer].

## 2016-09-01 NOTE — Telephone Encounter (Signed)
Patient was appropriately seen today by Ms. Harris, FNP, as she was not improving.

## 2016-09-21 DIAGNOSIS — H2512 Age-related nuclear cataract, left eye: Secondary | ICD-10-CM | POA: Diagnosis not present

## 2016-09-21 DIAGNOSIS — H35371 Puckering of macula, right eye: Secondary | ICD-10-CM | POA: Diagnosis not present

## 2016-09-21 DIAGNOSIS — H2513 Age-related nuclear cataract, bilateral: Secondary | ICD-10-CM | POA: Diagnosis not present

## 2016-09-21 DIAGNOSIS — H18413 Arcus senilis, bilateral: Secondary | ICD-10-CM | POA: Diagnosis not present

## 2016-09-21 DIAGNOSIS — H02839 Dermatochalasis of unspecified eye, unspecified eyelid: Secondary | ICD-10-CM | POA: Diagnosis not present

## 2016-10-12 DIAGNOSIS — H2512 Age-related nuclear cataract, left eye: Secondary | ICD-10-CM | POA: Diagnosis not present

## 2016-10-12 DIAGNOSIS — H2511 Age-related nuclear cataract, right eye: Secondary | ICD-10-CM | POA: Diagnosis not present

## 2016-10-12 DIAGNOSIS — H25812 Combined forms of age-related cataract, left eye: Secondary | ICD-10-CM | POA: Diagnosis not present

## 2016-10-19 DIAGNOSIS — H2512 Age-related nuclear cataract, left eye: Secondary | ICD-10-CM | POA: Diagnosis not present

## 2016-10-26 DIAGNOSIS — H25811 Combined forms of age-related cataract, right eye: Secondary | ICD-10-CM | POA: Diagnosis not present

## 2016-10-26 DIAGNOSIS — H2511 Age-related nuclear cataract, right eye: Secondary | ICD-10-CM | POA: Diagnosis not present

## 2016-10-31 ENCOUNTER — Ambulatory Visit (INDEPENDENT_AMBULATORY_CARE_PROVIDER_SITE_OTHER): Payer: Medicare Other | Admitting: Urgent Care

## 2016-10-31 VITALS — BP 130/66 | HR 66 | Temp 98.6°F | Resp 17 | Ht 65.0 in | Wt 178.0 lb

## 2016-10-31 DIAGNOSIS — R05 Cough: Secondary | ICD-10-CM

## 2016-10-31 DIAGNOSIS — J329 Chronic sinusitis, unspecified: Secondary | ICD-10-CM | POA: Diagnosis not present

## 2016-10-31 DIAGNOSIS — R059 Cough, unspecified: Secondary | ICD-10-CM

## 2016-10-31 MED ORDER — DOXYCYCLINE HYCLATE 100 MG PO CAPS: 100.0000 mg | ORAL_CAPSULE | Freq: Two times a day (BID) | ORAL | 0 refills | Status: DC
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
## 2016-10-31 NOTE — Patient Instructions (Addendum)
Sinusitis, Adult Sinusitis is soreness and inflammation of your sinuses. Sinuses are hollow spaces in the bones around your face. Your sinuses are located:  Around your eyes.  In the middle of your forehead.  Behind your nose.  In your cheekbones. Your sinuses and nasal passages are lined with a stringy fluid (mucus). Mucus normally drains out of your sinuses. When your nasal tissues become inflamed or swollen, the mucus can become trapped or blocked so air cannot flow through your sinuses. This allows bacteria, viruses, and funguses to grow, which leads to infection. Sinusitis can develop quickly and last for 7?10 days (acute) or for more than 12 weeks (chronic). Sinusitis often develops after a cold. What are the causes? This condition is caused by anything that creates swelling in the sinuses or stops mucus from draining, including:  Allergies.  Asthma.  Bacterial or viral infection.  Abnormally shaped bones between the nasal passages.  Nasal growths that contain mucus (nasal polyps).  Narrow sinus openings.  Pollutants, such as chemicals or irritants in the air.  A foreign object stuck in the nose.  A fungal infection. This is rare. What increases the risk? The following factors may make you more likely to develop this condition:  Having allergies or asthma.  Having had a recent cold or respiratory tract infection.  Having structural deformities or blockages in your nose or sinuses.  Having a weak immune system.  Doing a lot of swimming or diving.  Overusing nasal sprays.  Smoking. What are the signs or symptoms? The main symptoms of this condition are pain and a feeling of pressure around the affected sinuses. Other symptoms include:  Upper toothache.  Earache.  Headache.  Bad breath.  Decreased sense of smell and taste.  A cough that may get worse at night.  Fatigue.  Fever.  Thick drainage from your nose. The drainage is often green and it may  contain pus (purulent).  Stuffy nose or congestion.  Postnasal drip. This is when extra mucus collects in the throat or back of the nose.  Swelling and warmth over the affected sinuses.  Sore throat.  Sensitivity to light. How is this diagnosed? This condition is diagnosed based on symptoms, a medical history, and a physical exam. To find out if your condition is acute or chronic, your health care provider may:  Look in your nose for signs of nasal polyps.  Tap over the affected sinus to check for signs of infection.  View the inside of your sinuses using an imaging device that has a light attached (endoscope). If your health care provider suspects that you have chronic sinusitis, you may also:  Be tested for allergies.  Have a sample of mucus taken from your nose (nasal culture) and checked for bacteria.  Have a mucus sample examined to see if your sinusitis is related to an allergy. If your sinusitis does not respond to treatment and it lasts longer than 8 weeks, you may have an MRI or CT scan to check your sinuses. These scans also help to determine how severe your infection is. In rare cases, a bone biopsy may be done to rule out more serious types of fungal sinus disease. How is this treated? Treatment for sinusitis depends on the cause and whether your condition is chronic or acute. If a virus is causing your sinusitis, your symptoms will go away on their own within 10 days. You may be given medicines to relieve your symptoms, including:  Topical nasal decongestants. They   symptoms, including:  Topical nasal decongestants. They shrink swollen nasal passages and let mucus drain from your sinuses.  Antihistamines. These drugs block inflammation that is triggered by allergies. This can help to ease swelling in your nose and sinuses.  Topical nasal corticosteroids. These are nasal sprays that ease inflammation and swelling in your nose and sinuses.  Nasal saline washes. These rinses can help to get rid of thick mucus  in your nose.  If your condition is caused by bacteria, you will be given an antibiotic medicine. If your condition is caused by a fungus, you will be given an antifungal medicine. Surgery may be needed to correct underlying conditions, such as narrow nasal passages. Surgery may also be needed to remove polyps. Follow these instructions at home: Medicines  Take, use, or apply over-the-counter and prescription medicines only as told by your health care provider. These may include nasal sprays.  If you were prescribed an antibiotic medicine, take it as told by your health care provider. Do not stop taking the antibiotic even if you start to feel better. Hydrate and Humidify  Drink enough water to keep your urine clear or pale yellow. Staying hydrated will help to thin your mucus.  Use a cool mist humidifier to keep the humidity level in your home above 50%.  Inhale steam for 10-15 minutes, 3-4 times a day or as told by your health care provider. You can do this in the bathroom while a hot shower is running.  Limit your exposure to cool or dry air. Rest  Rest as much as possible.  Sleep with your head raised (elevated).  Make sure to get enough sleep each night. General instructions  Apply a warm, moist washcloth to your face 3-4 times a day or as told by your health care provider. This will help with discomfort.  Wash your hands often with soap and water to reduce your exposure to viruses and other germs. If soap and water are not available, use hand sanitizer.  Do not smoke. Avoid being around people who are smoking (secondhand smoke).  Keep all follow-up visits as told by your health care provider. This is important. Contact a health care provider if:  You have a fever.  Your symptoms get worse.  Your symptoms do not improve within 10 days. Get help right away if:  You have a severe headache.  You have persistent vomiting.  You have pain or swelling around your face or  eyes.  You have vision problems.  You develop confusion.  Your neck is stiff.  You have trouble breathing. This information is not intended to replace advice given to you by your health care provider. Make sure you discuss any questions you have with your health care provider. Document Released: 07/30/2005 Document Revised: 03/25/2016 Document Reviewed: 05/25/2015 Elsevier Interactive Patient Education  2017 Elsevier Inc.    IF you received an x-ray today, you will receive an invoice from [facility]. Please contact [facility] at [phone] with questions or concerns regarding your invoice.   IF you received labwork today, you will receive an invoice from [facility]. Please contact [facility] at [phone] with questions or concerns regarding your invoice.   Our billing staff will not be able to assist you with questions regarding bills from these companies.  You will be contacted with the lab results as soon as they are available. The fastest way to get your results is to activate your My Chart account. Instructions are located on the last page   If you have not heard from [place] regarding the results in 2 weeks, please contact this office.

## 2016-11-02 DIAGNOSIS — H2511 Age-related nuclear cataract, right eye: Secondary | ICD-10-CM | POA: Diagnosis not present

## 2016-11-05 ENCOUNTER — Telehealth: Payer: Self-pay | Admitting: Family Medicine

## 2016-11-05 MED ORDER — AZITHROMYCIN 250 MG PO TABS: ORAL_TABLET | ORAL | 0 refills | Status: DC

## 2016-11-05 NOTE — Telephone Encounter (Signed)
Body aches,nausea/vomiting, making her eyes burn. GI symptoms even taking it with food. Feels a lot better since stopping it last night.  Has previously tolerated azithromycin.  Meds ordered this encounter  Medications  . azithromycin (ZITHROMAX) 250 MG tablet    Sig: Take 2 tabs PO x 1 dose, then 1 tab PO QD x 4 days    Dispense:  6 tablet    Refill:  0    Order Specific Question:   Supervising Provider    Answer:   Brigitte Pulse, EVA N [[id number]]

## 2016-11-05 NOTE — Telephone Encounter (Signed)
Pt was seen on 10/31/16 and the doxycycline is making her sick and would like to know if she needs to come back in or can something else be called in   Best number

## 2016-11-12 ENCOUNTER — Ambulatory Visit (INDEPENDENT_AMBULATORY_CARE_PROVIDER_SITE_OTHER): Payer: Medicare Other | Admitting: Emergency Medicine

## 2016-11-12 ENCOUNTER — Ambulatory Visit (INDEPENDENT_AMBULATORY_CARE_PROVIDER_SITE_OTHER): Payer: Medicare Other

## 2016-11-12 VITALS — BP 126/77 | HR 70 | Temp 98.2°F | Resp 17 | Ht 65.0 in | Wt 178.0 lb

## 2016-11-12 DIAGNOSIS — J0101 Acute recurrent maxillary sinusitis: Secondary | ICD-10-CM | POA: Diagnosis not present

## 2016-11-12 DIAGNOSIS — J069 Acute upper respiratory infection, unspecified: Secondary | ICD-10-CM | POA: Insufficient documentation

## 2016-11-12 DIAGNOSIS — R05 Cough: Secondary | ICD-10-CM

## 2016-11-12 DIAGNOSIS — R059 Cough, unspecified: Secondary | ICD-10-CM

## 2016-11-12 LAB — POCT CBC
Granulocyte percent: 55.9 %G (ref 37–80)
HEMATOCRIT: 40.5 % (ref 37.7–47.9)
Hemoglobin: 14 g/dL (ref 12.2–16.2)
LYMPH, POC: 1.9 (ref 0.6–3.4)
MCH, POC: 31.3 pg — AB (ref 27–31.2)
MCHC: 34.7 g/dL (ref 31.8–35.4)
MCV: 90.4 fL (ref 80–97)
MID (cbc): 0.3 (ref 0–0.9)
MPV: 9.3 fL (ref 0–99.8)
POC GRANULOCYTE: 2.9 (ref 2–6.9)
POC LYMPH %: 37.5 % (ref 10–50)
POC MID %: 6.6 %M (ref 0–12)
Platelet Count, POC: 184 10*3/uL (ref 142–424)
RBC: 4.47 M/uL (ref 4.04–5.48)
RDW, POC: 14.8 %
WBC: 5.2 10*3/uL (ref 4.6–10.2)

## 2016-11-12 MED ORDER — AZITHROMYCIN 250 MG PO TABS: ORAL_TABLET | ORAL | 0 refills | Status: DC
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
## 2016-11-12 NOTE — Patient Instructions (Addendum)
     IF you received an x-ray today, you will receive an invoice from [facility]. Please contact [facility] at [phone] with questions or concerns regarding your invoice.   IF you received labwork today, you will receive an invoice from [facility]. Please contact [facility] at [phone] with questions or concerns regarding your invoice.   Our billing staff will not be able to assist you with questions regarding bills from these companies.  You will be contacted with the lab results as soon as they are available. The fastest way to get your results is to activate your My Chart account. Instructions are located on the last page of this paperwork. If you have not heard from us regarding the results in 2 weeks, please contact this office.      Upper Respiratory Infection, Adult Most upper respiratory infections (URIs) are caused by a virus. A URI affects the nose, throat, and upper air passages. The most common type of URI is often called "the common cold." Follow these instructions at home:  Take medicines only as told by your doctor.  Gargle warm saltwater or take cough drops to comfort your throat as told by your doctor.  Use a warm mist humidifier or inhale steam from a shower to increase air moisture. This may make it easier to breathe.  Drink enough fluid to keep your pee (urine) clear or pale yellow.  Eat soups and other clear broths.  Have a healthy diet.  Rest as needed.  Go back to work when your fever is gone or your doctor says it is okay.  You may need to stay home longer to avoid giving your URI to others.  You can also wear a face mask and wash your hands often to prevent spread of the virus.  Use your inhaler more if you have asthma.  Do not use any tobacco products, including cigarettes, chewing tobacco, or electronic cigarettes. If you need help quitting, ask your doctor. Contact a doctor if:  You are getting worse, not better.  Your  symptoms are not helped by medicine.  You have chills.  You are getting more short of breath.  You have brown or red mucus.  You have yellow or brown discharge from your nose.  You have pain in your face, especially when you bend forward.  You have a fever.  You have puffy (swollen) neck glands.  You have pain while swallowing.  You have white areas in the back of your throat. Get help right away if:  You have very bad or constant:  Headache.  Ear pain.  Pain in your forehead, behind your eyes, and over your cheekbones (sinus pain).  Chest pain.  You have long-lasting (chronic) lung disease and any of the following:  Wheezing.  Long-lasting cough.  Coughing up blood.  A change in your usual mucus.  You have a stiff neck.  You have changes in your:  Vision.  Hearing.  Thinking.  Mood. This information is not intended to replace advice given to you by your health care provider. Make sure you discuss any questions you have with your health care provider. Document Released: 01/16/2008 Document Revised: 04/01/2016 Document Reviewed: 11/04/2013 Elsevier Interactive Patient Education  2017 Elsevier Inc.  

## 2016-11-13 LAB — COMPREHENSIVE METABOLIC PANEL
A/G RATIO: 1.7 (ref 1.2–2.2)
ALBUMIN: 4.2 g/dL (ref 3.5–4.8)
ALK PHOS: 93 IU/L (ref 39–117)
ALT: 18 IU/L (ref 0–32)
AST: 28 IU/L (ref 0–40)
BUN / CREAT RATIO: 26 (ref 12–28)
BUN: 23 mg/dL (ref 8–27)
Bilirubin Total: 0.4 mg/dL (ref 0.0–1.2)
CO2: 25 mmol/L (ref 18–29)
Calcium: 9.4 mg/dL (ref 8.7–10.3)
Chloride: 102 mmol/L (ref 96–106)
Creatinine, Ser: 0.9 mg/dL (ref 0.57–1.00)
GFR calc Af Amer: 74 mL/min/{1.73_m2} (ref >59)
GFR, EST NON AFRICAN AMERICAN: 64 mL/min/{1.73_m2} (ref >59)
GLOBULIN, TOTAL: 2.5 g/dL (ref 1.5–4.5)
Glucose: 86 mg/dL (ref 65–99)
POTASSIUM: 4.8 mmol/L (ref 3.5–5.2)
SODIUM: 144 mmol/L (ref 134–144)
Total Protein: 6.7 g/dL (ref 6.0–8.5)

## 2016-11-17 DIAGNOSIS — M545 Low back pain: Secondary | ICD-10-CM | POA: Diagnosis not present

## 2016-11-21 ENCOUNTER — Encounter: Payer: Self-pay | Admitting: *Deleted

## 2016-11-29 DIAGNOSIS — M47816 Spondylosis without myelopathy or radiculopathy, lumbar region: Secondary | ICD-10-CM | POA: Diagnosis not present

## 2016-12-13 DIAGNOSIS — E559 Vitamin D deficiency, unspecified: Secondary | ICD-10-CM | POA: Diagnosis not present

## 2016-12-13 DIAGNOSIS — R5383 Other fatigue: Secondary | ICD-10-CM | POA: Diagnosis not present

## 2016-12-13 DIAGNOSIS — M810 Age-related osteoporosis without current pathological fracture: Secondary | ICD-10-CM | POA: Diagnosis not present

## 2016-12-20 DIAGNOSIS — M810 Age-related osteoporosis without current pathological fracture: Secondary | ICD-10-CM | POA: Diagnosis not present

## 2016-12-20 DIAGNOSIS — E559 Vitamin D deficiency, unspecified: Secondary | ICD-10-CM | POA: Diagnosis not present

## 2016-12-21 DIAGNOSIS — M5416 Radiculopathy, lumbar region: Secondary | ICD-10-CM | POA: Diagnosis not present

## 2017-01-01 DIAGNOSIS — M5416 Radiculopathy, lumbar region: Secondary | ICD-10-CM | POA: Diagnosis not present

## 2017-03-26 DIAGNOSIS — M5416 Radiculopathy, lumbar region: Secondary | ICD-10-CM | POA: Diagnosis not present

## 2017-04-17 ENCOUNTER — Ambulatory Visit (INDEPENDENT_AMBULATORY_CARE_PROVIDER_SITE_OTHER): Payer: Medicare Other | Admitting: Emergency Medicine

## 2017-04-17 ENCOUNTER — Encounter: Payer: Self-pay | Admitting: Emergency Medicine

## 2017-04-17 VITALS — BP 126/61 | HR 88 | Temp 98.3°F | Resp 17 | Ht 65.0 in | Wt 183.0 lb

## 2017-04-17 DIAGNOSIS — I10 Essential (primary) hypertension: Secondary | ICD-10-CM | POA: Diagnosis not present

## 2017-04-17 DIAGNOSIS — R1013 Epigastric pain: Secondary | ICD-10-CM

## 2017-04-17 LAB — POCT CBC
GRANULOCYTE PERCENT: 62.3 % (ref 37–80)
HCT, POC: 41.6 % (ref 37.7–47.9)
Hemoglobin: 14.4 g/dL (ref 12.2–16.2)
Lymph, poc: 1.9 (ref 0.6–3.4)
MCH, POC: 31.2 pg (ref 27–31.2)
MCHC: 34.7 g/dL (ref 31.8–35.4)
MCV: 89.8 fL (ref 80–97)
MID (cbc): 0.3 (ref 0–0.9)
MPV: 8.7 fL (ref 0–99.8)
PLATELET COUNT, POC: 179 10*3/uL (ref 142–424)
POC Granulocyte: 3.7 (ref 2–6.9)
POC LYMPH %: 32.2 % (ref 10–50)
POC MID %: 5.5 %M (ref 0–12)
RBC: 4.63 M/uL (ref 4.04–5.48)
RDW, POC: 13.8 %
WBC: 6 10*3/uL (ref 4.6–10.2)

## 2017-04-17 MED ORDER — LISINOPRIL-HYDROCHLOROTHIAZIDE 10-12.5 MG PO TABS: 1.0000 | ORAL_TABLET | Freq: Every day | ORAL | 3 refills | Status: DC

## 2017-04-17 NOTE — Patient Instructions (Addendum)
Appointment is 04/18/2017 at 7:00 am, arrive 10 minutes before appointment. Go to Main Entrance at [facility], nothing to eat or drink after 12 midnight.    IF you received an x-ray today, you will receive an invoice from [facility]. Please contact [facility] at [phone] with questions or concerns regarding your invoice.   IF you received labwork today, you will receive an invoice from [facility]. Please contact [facility] at [phone] with questions or concerns regarding your invoice.   Our billing staff will not be able to assist you with questions regarding bills from these companies.  You will be contacted with the lab results as soon as they are available. The fastest way to get your results is to activate your My Chart account. Instructions are located on the last page of this paperwork. If you have not heard from [place] regarding the results in 2 weeks, please contact this office.      Abdominal Pain, Adult Many things can cause belly (abdominal) pain. Most times, belly pain is not dangerous. Many cases of belly pain can be watched and treated at home. Sometimes belly pain is serious, though. Your doctor will try to find the cause of your belly pain. Follow these instructions at home:  Take over-the-counter and prescription medicines only as told by your doctor. Do not take medicines that help you poop (laxatives) unless told to by your doctor.  Drink enough fluid to keep your pee (urine) clear or pale yellow.  Watch your belly pain for any changes.  Keep all follow-up visits as told by your doctor. This is important. Contact a doctor if:  Your belly pain changes or gets worse.  You are not hungry, or you lose weight without trying.  You are having trouble pooping (constipated) or have watery poop (diarrhea) for more than 2-3 days.  You have pain when you pee or poop.  Your belly pain wakes you up at night.  Your pain gets worse with meals, after  eating, or with certain foods.  You are throwing up and cannot keep anything down.  You have a fever. Get help right away if:  Your pain does not go away as soon as your doctor says it should.  You cannot stop throwing up.  Your pain is only in areas of your belly, such as the right side or the left lower part of the belly.  You have bloody or black poop, or poop that looks like tar.  You have very bad pain, cramping, or bloating in your belly.  You have signs of not having enough fluid or water in your body (dehydration), such as: ? Dark pee, very little pee, or no pee. ? Cracked lips. ? Dry mouth. ? Sunken eyes. ? Sleepiness. ? Weakness. This information is not intended to replace advice given to you by your health care provider. Make sure you discuss any questions you have with your health care provider. Document Released: 01/16/2008 Document Revised: 02/17/2016 Document Reviewed: 01/11/2016 Elsevier Interactive Patient Education  2017 [employer].
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
## 2017-04-18 ENCOUNTER — Encounter: Payer: Self-pay | Admitting: Emergency Medicine

## 2017-04-18 ENCOUNTER — Ambulatory Visit (HOSPITAL_COMMUNITY)
Admission: RE | Admit: 2017-04-18 | Discharge: 2017-04-18 | Disposition: A | Discharge status: Home / Self Care | Payer: Medicare Other | Source: Ambulatory Visit | Attending: Emergency Medicine | Admitting: Emergency Medicine

## 2017-04-18 DIAGNOSIS — R1013 Epigastric pain: Secondary | ICD-10-CM | POA: Diagnosis not present

## 2017-04-18 LAB — COMPREHENSIVE METABOLIC PANEL
ALBUMIN: 4.6 g/dL (ref 3.5–4.8)
ALT: 13 IU/L (ref 0–32)
AST: 22 IU/L (ref 0–40)
Albumin/Globulin Ratio: 1.9 (ref 1.2–2.2)
Alkaline Phosphatase: 61 IU/L (ref 39–117)
BUN / CREAT RATIO: 19 (ref 12–28)
BUN: 20 mg/dL (ref 8–27)
Bilirubin Total: 0.3 mg/dL (ref 0.0–1.2)
CALCIUM: 9.7 mg/dL (ref 8.7–10.3)
CO2: 26 mmol/L (ref 20–29)
CREATININE: 1.05 mg/dL — AB (ref 0.57–1.00)
Chloride: 100 mmol/L (ref 96–106)
GFR, EST AFRICAN AMERICAN: 61 mL/min/{1.73_m2} (ref >59)
GFR, EST NON AFRICAN AMERICAN: 53 mL/min/{1.73_m2} — AB (ref >59)
GLOBULIN, TOTAL: 2.4 g/dL (ref 1.5–4.5)
GLUCOSE: 85 mg/dL (ref 65–99)
Potassium: 4.8 mmol/L (ref 3.5–5.2)
SODIUM: 140 mmol/L (ref 134–144)
TOTAL PROTEIN: 7 g/dL (ref 6.0–8.5)

## 2017-04-18 LAB — LIPASE: Lipase: 61 U/L (ref 14–85)

## 2017-05-16 DIAGNOSIS — Z09 Encounter for follow-up examination after completed treatment for conditions other than malignant neoplasm: Secondary | ICD-10-CM | POA: Diagnosis not present

## 2017-05-16 DIAGNOSIS — M25561 Pain in right knee: Secondary | ICD-10-CM | POA: Diagnosis not present

## 2017-05-16 DIAGNOSIS — Z96651 Presence of right artificial knee joint: Secondary | ICD-10-CM | POA: Diagnosis not present

## 2017-06-19 ENCOUNTER — Encounter: Payer: Self-pay | Admitting: Family Medicine

## 2017-08-12 ENCOUNTER — Encounter: Payer: Medicare Other | Admitting: Family Medicine

## 2017-08-12 ENCOUNTER — Ambulatory Visit: Payer: Medicare Other

## 2017-08-15 ENCOUNTER — Other Ambulatory Visit: Payer: Self-pay

## 2017-08-15 ENCOUNTER — Encounter: Payer: Self-pay | Admitting: Family Medicine

## 2017-08-15 ENCOUNTER — Ambulatory Visit (INDEPENDENT_AMBULATORY_CARE_PROVIDER_SITE_OTHER): Payer: Medicare Other | Admitting: Family Medicine

## 2017-08-15 VITALS — BP 129/77 | HR 100 | Temp 98.1°F | Resp 16 | Ht 65.0 in | Wt 179.4 lb

## 2017-08-15 DIAGNOSIS — J069 Acute upper respiratory infection, unspecified: Secondary | ICD-10-CM

## 2017-08-15 DIAGNOSIS — R0981 Nasal congestion: Secondary | ICD-10-CM

## 2017-08-15 MED ORDER — AZITHROMYCIN 250 MG PO TABS: ORAL_TABLET | ORAL | 0 refills | Status: DC

## 2017-08-15 NOTE — Patient Instructions (Addendum)
     IF you received an x-ray today, you will receive an invoice from [facility]. Please contact [facility] at [phone] with questions or concerns regarding your invoice.   IF you received labwork today, you will receive an invoice from [facility]. Please contact [facility] at [phone] with questions or concerns regarding your invoice.   Our billing staff will not be able to assist you with questions regarding bills from these companies.  You will be contacted with the lab results as soon as they are available. The fastest way to get your results is to activate your My Chart account. Instructions are located on the last page of this paperwork. If you have not heard from us regarding the results in 2 weeks, please contact this office.     Cough, Adult Coughing is a reflex that clears your throat and your airways. Coughing helps to heal and protect your lungs. It is normal to cough occasionally, but a cough that happens with other symptoms or lasts a long time may be a sign of a condition that needs treatment. A cough may last only 2-3 weeks (acute), or it may last longer than 8 weeks (chronic). What are the causes? Coughing is commonly caused by:  Breathing in substances that irritate your lungs.  A viral or bacterial respiratory infection.  Allergies.  Asthma.  Postnasal drip.  Smoking.  Acid backing up from the stomach into the esophagus (gastroesophageal reflux).  Certain medicines.  Chronic lung problems, including COPD (or rarely, lung cancer).  Other medical conditions such as heart failure.  Follow these instructions at home: Pay attention to any changes in your symptoms. Take these actions to help with your discomfort:  Take medicines only as told by your health care provider. ? If you were prescribed an antibiotic medicine, take it as told by your health care provider. Do not stop taking the antibiotic even if you start to feel better. ? Talk  with your health care provider before you take a cough suppressant medicine.  Drink enough fluid to keep your urine clear or pale yellow.  If the air is dry, use a cold steam vaporizer or humidifier in your bedroom or your home to help loosen secretions.  Avoid anything that causes you to cough at work or at home.  If your cough is worse at night, try sleeping in a semi-upright position.  Avoid cigarette smoke. If you smoke, quit smoking. If you need help quitting, ask your health care provider.  Avoid caffeine.  Avoid alcohol.  Rest as needed.  Contact a health care provider if:  You have new symptoms.  You cough up pus.  Your cough does not get better after 2-3 weeks, or your cough gets worse.  You cannot control your cough with suppressant medicines and you are losing sleep.  You develop pain that is getting worse or pain that is not controlled with pain medicines.  You have a fever.  You have unexplained weight loss.  You have night sweats. Get help right away if:  You cough up blood.  You have difficulty breathing.  Your heartbeat is very fast. This information is not intended to replace advice given to you by your health care provider. Make sure you discuss any questions you have with your health care provider. Document Released: 01/26/2011 Document Revised: 01/05/2016 Document Reviewed: 10/06/2014 Elsevier Interactive Patient Education  2018 Elsevier Inc.  
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
## 2017-08-16 ENCOUNTER — Encounter: Payer: Medicare Other | Admitting: Family Medicine

## 2017-08-23 ENCOUNTER — Ambulatory Visit: Payer: Medicare Other

## 2017-08-30 ENCOUNTER — Encounter: Payer: Self-pay | Admitting: Family Medicine

## 2017-08-30 ENCOUNTER — Ambulatory Visit (INDEPENDENT_AMBULATORY_CARE_PROVIDER_SITE_OTHER): Payer: Medicare Other | Admitting: Family Medicine

## 2017-08-30 ENCOUNTER — Other Ambulatory Visit: Payer: Self-pay

## 2017-08-30 VITALS — BP 142/70 | HR 100 | Temp 97.7°F | Ht 65.0 in | Wt 181.4 lb

## 2017-08-30 DIAGNOSIS — H579 Unspecified disorder of eye and adnexa: Secondary | ICD-10-CM | POA: Diagnosis not present

## 2017-08-30 DIAGNOSIS — J309 Allergic rhinitis, unspecified: Secondary | ICD-10-CM

## 2017-08-30 MED ORDER — FLUTICASONE PROPIONATE 50 MCG/ACT NA SUSP: 1.0000 | Freq: Two times a day (BID) | NASAL | 6 refills | Status: DC

## 2017-08-30 MED ORDER — OLOPATADINE HCL 0.2 % OP SOLN: 1.0000 [drp] | Freq: Every day | OPHTHALMIC | 1 refills | Status: DC
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
## 2017-08-30 NOTE — Patient Instructions (Addendum)
1. Nasal saline washes 2-3 times a day 2. Oral decongestant, phenylephrine    IF you received an x-ray today, you will receive an invoice from [facility]. Please contact [facility] at [phone] with questions or concerns regarding your invoice.   IF you received labwork today, you will receive an invoice from [place]. Please contact [facility] at [phone] with questions or concerns regarding your invoice.   Our billing staff will not be able to assist you with questions regarding bills from these companies.  You will be contacted with the lab results as soon as they are available. The fastest way to get your results is to activate your My Chart account. Instructions are located on the last page of this paperwork. If you have not heard from [place] regarding the results in 2 weeks, please contact this office.

## 2017-09-11 ENCOUNTER — Encounter: Payer: Self-pay | Admitting: Family Medicine

## 2017-10-03 ENCOUNTER — Ambulatory Visit: Payer: Medicare Other

## 2017-10-03 ENCOUNTER — Encounter: Payer: Medicare Other | Admitting: Family Medicine

## 2017-10-07 ENCOUNTER — Ambulatory Visit (INDEPENDENT_AMBULATORY_CARE_PROVIDER_SITE_OTHER): Payer: Medicare Other | Admitting: Family Medicine

## 2017-10-07 ENCOUNTER — Other Ambulatory Visit: Payer: Self-pay

## 2017-10-07 ENCOUNTER — Ambulatory Visit (INDEPENDENT_AMBULATORY_CARE_PROVIDER_SITE_OTHER): Payer: Medicare Other

## 2017-10-07 VITALS — BP 142/76 | HR 69 | Ht 65.0 in | Wt 183.5 lb

## 2017-10-07 VITALS — BP 142/76 | HR 69 | Ht 65.0 in | Wt 183.8 lb

## 2017-10-07 DIAGNOSIS — F419 Anxiety disorder, unspecified: Secondary | ICD-10-CM | POA: Diagnosis not present

## 2017-10-07 DIAGNOSIS — Z1159 Encounter for screening for other viral diseases: Secondary | ICD-10-CM | POA: Diagnosis not present

## 2017-10-07 DIAGNOSIS — F5101 Primary insomnia: Secondary | ICD-10-CM

## 2017-10-07 DIAGNOSIS — Z23 Encounter for immunization: Secondary | ICD-10-CM

## 2017-10-07 DIAGNOSIS — Z5181 Encounter for therapeutic drug level monitoring: Secondary | ICD-10-CM | POA: Diagnosis not present

## 2017-10-07 DIAGNOSIS — I10 Essential (primary) hypertension: Secondary | ICD-10-CM

## 2017-10-07 DIAGNOSIS — E785 Hyperlipidemia, unspecified: Secondary | ICD-10-CM

## 2017-10-07 DIAGNOSIS — Z0000 Encounter for general adult medical examination without abnormal findings: Secondary | ICD-10-CM

## 2017-10-07 MED ORDER — TRAZODONE HCL 50 MG PO TABS: ORAL_TABLET | ORAL | 3 refills | Status: DC
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
## 2017-10-07 NOTE — Patient Instructions (Addendum)
Start Trazodone 25mg  for a week (1/2 tablet) Then increase to 50mg  for a week     IF you received an x-ray today, you will receive an invoice from [facility]. Please contact [facility] at [phone] with questions or concerns regarding your invoice.   IF you received labwork today, you will receive an invoice from [place]. Please contact [facility] at [phone] with questions or concerns regarding your invoice.   Our billing staff will not be able to assist you with questions regarding bills from these companies.  You will be contacted with the lab results as soon as they are available. The fastest way to get your results is to activate your My Chart account. Instructions are located on the last page of this paperwork. If you have not heard from [place] regarding the results in 2 weeks, please contact this office.    Insomnia Insomnia is a sleep disorder that makes it difficult to fall asleep or to stay asleep. Insomnia can cause tiredness (fatigue), low energy, difficulty concentrating, mood swings, and poor performance at work or school. There are three different ways to classify insomnia:  Difficulty falling asleep.  Difficulty staying asleep.  Waking up too early in the morning.  Any type of insomnia can be long-term (chronic) or short-term (acute). Both are common. Short-term insomnia usually lasts for three months or less. Chronic insomnia occurs at least three times a week for longer than three months. What are the causes? Insomnia may be caused by another condition, situation, or substance, such as:  Anxiety.  Certain medicines.  Gastroesophageal reflux disease (GERD) or other gastrointestinal conditions.  Asthma or other breathing conditions.  Restless legs syndrome, sleep apnea, or other sleep disorders.  Chronic pain.  Menopause. This may include hot flashes.  Stroke.  Abuse of alcohol, tobacco, or illegal  drugs.  Depression.  Caffeine.  Neurological disorders, such as Alzheimer disease.  An overactive thyroid (hyperthyroidism).  The cause of insomnia may not be known. What increases the risk? Risk factors for insomnia include:  Gender. Women are more commonly affected than men.  Age. Insomnia is more common as you get older.  Stress. This may involve your professional or personal life.  Income. Insomnia is more common in people with lower income.  Lack of exercise.  Irregular work schedule or night shifts.  Traveling between different time zones.  What are the signs or symptoms? If you have insomnia, trouble falling asleep or trouble staying asleep is the main symptom. This may lead to other symptoms, such as:  Feeling fatigued.  Feeling nervous about going to sleep.  Not feeling rested in the morning.  Having trouble concentrating.  Feeling irritable, anxious, or depressed.  How is this treated? Treatment for insomnia depends on the cause. If your insomnia is caused by an underlying condition, treatment will focus on addressing the condition. Treatment may also include:  Medicines to help you sleep.  Counseling or therapy.  Lifestyle adjustments.  Follow these instructions at home:  Take medicines only as directed by your health care provider.  Keep regular sleeping and waking hours. Avoid naps.  Keep a sleep diary to help you and your health care provider figure out what could be causing your insomnia. Include: ? When you sleep. ? When you wake up during the night. ? How well you sleep. ? How rested you feel the next day. ? Any side effects of medicines you are taking. ? What you eat and drink.  Make your bedroom a  comfortable place where it is easy to fall asleep: ? Put up shades or special blackout curtains to block light from outside. ? Use a white noise machine to block noise. ? Keep the temperature cool.  Exercise regularly as directed by  your health care provider. Avoid exercising right before bedtime.  Use relaxation techniques to manage stress. Ask your health care provider to suggest some techniques that may work well for you. These may include: ? Breathing exercises. ? Routines to release muscle tension. ? Visualizing peaceful scenes.  Cut back on alcohol, caffeinated beverages, and cigarettes, especially close to bedtime. These can disrupt your sleep.  Do not overeat or eat spicy foods right before bedtime. This can lead to digestive discomfort that can make it hard for you to sleep.  Limit screen use before bedtime. This includes: ? Watching TV. ? Using your smartphone, tablet, and computer.  Stick to a routine. This can help you fall asleep faster. Try to do a quiet activity, brush your teeth, and go to bed at the same time each night.  Get out of bed if you are still awake after 15 minutes of trying to sleep. Keep the lights down, but try reading or doing a quiet activity. When you feel sleepy, go back to bed.  Make sure that you drive carefully. Avoid driving if you feel very sleepy.  Keep all follow-up appointments as directed by your health care provider. This is important. Contact a health care provider if:  You are tired throughout the day or have trouble in your daily routine due to sleepiness.  You continue to have sleep problems or your sleep problems get worse. Get help right away if:  You have serious thoughts about hurting yourself or someone else. This information is not intended to replace advice given to you by your health care provider. Make sure you discuss any questions you have with your health care provider. Document Released: 07/27/2000 Document Revised: 12/30/2015 Document Reviewed: 04/30/2014 Elsevier Interactive Patient Education  [employer].
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
## 2017-10-07 NOTE — Patient Instructions (Addendum)
Kelli Swanson , Thank you for taking time to come for your Medicare Wellness Visit. I appreciate your ongoing commitment to your health goals. Please review the following plan we discussed and let me know if I can assist you in the future.   Screening recommendations/referrals: Colonoscopy: declined  Mammogram: up to date, next due 07/31/2019 Bone Density: up to date, Please have your doctor send [place] the report. Recommended yearly ophthalmology/optometry visit for glaucoma screening and checkup Recommended yearly dental visit for hygiene and checkup  Vaccinations: Influenza vaccine: up to date Pneumococcal vaccine: administered today  Tdap vaccine: declined due to insurance Shingles vaccine: Check with your pharmacy about receiving the Shingrix vaccine    Advanced directives: Please bring a copy of your POA (Power of [place]) and/or Living Will to your next appointment.   Conditions/risks identified: Try to lose some weight and get to around 165 lbs one day.  Next appointment: today @ 9 am with Dr. Nolon Rod, next AWV in 1 year    Preventive Care 45 Years and Older, Female Preventive care refers to lifestyle choices and visits with your health care provider that can promote health and wellness. What does preventive care include?  A yearly physical exam. This is also called an annual well check.  Dental exams once or twice a year.  Routine eye exams. Ask your health care provider how often you should have your eyes checked.  Personal lifestyle choices, including:  Daily care of your teeth and gums.  Regular physical activity.  Eating a healthy diet.  Avoiding tobacco and drug use.  Limiting alcohol use.  Practicing safe sex.  Taking low-dose aspirin every day.  Taking vitamin and mineral supplements as recommended by your health care provider. What happens during an annual well check? The services and screenings done by your health care provider during your annual  well check will depend on your age, overall health, lifestyle risk factors, and family history of disease. Counseling  Your health care provider may ask you questions about your:  Alcohol use.  Tobacco use.  Drug use.  Emotional well-being.  Home and relationship well-being.  Sexual activity.  Eating habits.  History of falls.  Memory and ability to understand (cognition).  Work and work Statistician.  Reproductive health. Screening  You may have the following tests or measurements:  Height, weight, and BMI.  Blood pressure.  Lipid and cholesterol levels. These may be checked every 5 years, or more frequently if you are over 25 years old.  Skin check.  Lung cancer screening. You may have this screening every year starting at age 74 if you have a 30-pack-year history of smoking and currently smoke or have quit within the past 15 years.  Fecal occult blood test (FOBT) of the stool. You may have this test every year starting at age 70.  Flexible sigmoidoscopy or colonoscopy. You may have a sigmoidoscopy every 5 years or a colonoscopy every 10 years starting at age 55.  Hepatitis C blood test.  Hepatitis B blood test.  Sexually transmitted disease (STD) testing.  Diabetes screening. This is done by checking your blood sugar (glucose) after you have not eaten for a while (fasting). You may have this done every 1-3 years.  Bone density scan. This is done to screen for osteoporosis. You may have this done starting at age 33.  Mammogram. This may be done every 1-2 years. Talk to your health care provider about how often you should have regular mammograms. Talk with your  health care provider about your test results, treatment options, and if necessary, the need for more tests. Vaccines  Your health care provider may recommend certain vaccines, such as:  Influenza vaccine. This is recommended every year.  Tetanus, diphtheria, and acellular pertussis (Tdap, Td) vaccine.  You may need a Td booster every 10 years.  Zoster vaccine. You may need this after age 26.  Pneumococcal 13-valent conjugate (PCV13) vaccine. One dose is recommended after age 63.  Pneumococcal polysaccharide (PPSV23) vaccine. One dose is recommended after age 81. Talk to your health care provider about which screenings and vaccines you need and how often you need them. This information is not intended to replace advice given to you by your health care provider. Make sure you discuss any questions you have with your health care provider. Document Released: 08/26/2015 Document Revised: 04/18/2016 Document Reviewed: 05/31/2015 Elsevier Interactive Patient Education  2017 [place] Prevention in the Home Falls can cause injuries. They can happen to people of all ages. There are many things you can do to make your home safe and to help prevent falls. What can I do on the outside of my home?  Regularly fix the edges of walkways and driveways and fix any cracks.  Remove anything that might make you trip as you walk through a door, such as a raised step or threshold.  Trim any bushes or trees on the path to your home.  Use bright outdoor lighting.  Clear any walking paths of anything that might make someone trip, such as rocks or tools.  Regularly check to see if handrails are loose or broken. Make sure that both sides of any steps have handrails.  Any raised decks and porches should have guardrails on the edges.  Have any leaves, snow, or ice cleared regularly.  Use sand or salt on walking paths during winter.  Clean up any spills in your garage right away. This includes oil or grease spills. What can I do in the bathroom?  Use night lights.  Install grab bars by the toilet and in the tub and shower. Do not use towel bars as grab bars.  Use non-skid mats or decals in the tub or shower.  If you need to sit down in the shower, use a plastic, non-slip stool.  Keep the  floor dry. Clean up any water that spills on the floor as soon as it happens.  Remove soap buildup in the tub or shower regularly.  Attach bath mats securely with double-sided non-slip rug tape.  Do not have throw rugs and other things on the floor that can make you trip. What can I do in the bedroom?  Use night lights.  Make sure that you have a light by your bed that is easy to reach.  Do not use any sheets or blankets that are too big for your bed. They should not hang down onto the floor.  Have a firm chair that has side arms. You can use this for support while you get dressed.  Do not have throw rugs and other things on the floor that can make you trip. What can I do in the kitchen?  Clean up any spills right away.  Avoid walking on wet floors.  Keep items that you use a lot in easy-to-reach places.  If you need to reach something above you, use a strong step stool that has a grab bar.  Keep electrical cords out of the way.  Do not use  floor polish or wax that makes floors slippery. If you must use wax, use non-skid floor wax.  Do not have throw rugs and other things on the floor that can make you trip. What can I do with my stairs?  Do not leave any items on the stairs.  Make sure that there are handrails on both sides of the stairs and use them. Fix handrails that are broken or loose. Make sure that handrails are as long as the stairways.  Check any carpeting to make sure that it is firmly attached to the stairs. Fix any carpet that is loose or worn.  Avoid having throw rugs at the top or bottom of the stairs. If you do have throw rugs, attach them to the floor with carpet tape.  Make sure that you have a light switch at the top of the stairs and the bottom of the stairs. If you do not have them, ask someone to add them for you. What else can I do to help prevent falls?  Wear shoes that:  Do not have high heels.  Have rubber bottoms.  Are comfortable and fit  you well.  Are closed at the toe. Do not wear sandals.  If you use a stepladder:  Make sure that it is fully opened. Do not climb a closed stepladder.  Make sure that both sides of the stepladder are locked into place.  Ask someone to hold it for you, if possible.  Clearly mark and make sure that you can see:  Any grab bars or handrails.  First and last steps.  Where the edge of each step is.  Use tools that help you move around (mobility aids) if they are needed. These include:  Canes.  Walkers.  Scooters.  Crutches.  Turn on the lights when you go into a dark area. Replace any light bulbs as soon as they burn out.  Set up your furniture so you have a clear path. Avoid moving your furniture around.  If any of your floors are uneven, fix them.  If there are any pets around you, be aware of where they are.  Review your medicines with your doctor. Some medicines can make you feel dizzy. This can increase your chance of falling. Ask your doctor what other things that you can do to help prevent falls. This information is not intended to replace advice given to you by your health care provider. Make sure you discuss any questions you have with your health care provider. Document Released: 05/26/2009 Document Revised: 01/05/2016 Document Reviewed: 09/03/2014 Elsevier Interactive Patient Education  2017 [employer].

## 2017-10-08 LAB — CBC WITH DIFFERENTIAL/PLATELET
BASOS ABS: 0 10*3/uL (ref 0.0–0.2)
BASOS: 1 %
EOS (ABSOLUTE): 0.1 10*3/uL (ref 0.0–0.4)
Eos: 3 %
Hematocrit: 41.1 % (ref 34.0–46.6)
Hemoglobin: 14 g/dL (ref 11.1–15.9)
IMMATURE GRANS (ABS): 0 10*3/uL (ref 0.0–0.1)
Immature Granulocytes: 0 %
LYMPHS: 29 %
Lymphocytes Absolute: 1.5 10*3/uL (ref 0.7–3.1)
MCH: 31 pg (ref 26.6–33.0)
MCHC: 34.1 g/dL (ref 31.5–35.7)
MCV: 91 fL (ref 79–97)
MONOS ABS: 0.3 10*3/uL (ref 0.1–0.9)
Monocytes: 7 %
NEUTROS ABS: 3.1 10*3/uL (ref 1.4–7.0)
Neutrophils: 60 %
PLATELETS: 190 10*3/uL (ref 150–379)
RBC: 4.51 x10E6/uL (ref 3.77–5.28)
RDW: 14.7 % (ref 12.3–15.4)
WBC: 5.1 10*3/uL (ref 3.4–10.8)

## 2017-10-08 LAB — LIPID PANEL
CHOL/HDL RATIO: 2.8 ratio (ref 0.0–4.4)
CHOLESTEROL TOTAL: 217 mg/dL — AB (ref 100–199)
HDL: 78 mg/dL (ref >39)
LDL CALC: 113 mg/dL — AB (ref 0–99)
TRIGLYCERIDES: 131 mg/dL (ref 0–149)
VLDL Cholesterol Cal: 26 mg/dL (ref 5–40)

## 2017-10-08 LAB — COMPREHENSIVE METABOLIC PANEL
A/G RATIO: 1.8 (ref 1.2–2.2)
ALT: 32 IU/L (ref 0–32)
AST: 21 IU/L (ref 0–40)
Albumin: 4.2 g/dL (ref 3.5–4.8)
Alkaline Phosphatase: 80 IU/L (ref 39–117)
BILIRUBIN TOTAL: 0.5 mg/dL (ref 0.0–1.2)
BUN/Creatinine Ratio: 17 (ref 12–28)
BUN: 16 mg/dL (ref 8–27)
CHLORIDE: 100 mmol/L (ref 96–106)
CO2: 26 mmol/L (ref 20–29)
Calcium: 9.5 mg/dL (ref 8.7–10.3)
Creatinine, Ser: 0.95 mg/dL (ref 0.57–1.00)
GFR calc Af Amer: 69 mL/min/{1.73_m2} (ref >59)
GFR, EST NON AFRICAN AMERICAN: 60 mL/min/{1.73_m2} (ref >59)
GLOBULIN, TOTAL: 2.3 g/dL (ref 1.5–4.5)
Glucose: 94 mg/dL (ref 65–99)
POTASSIUM: 4.5 mmol/L (ref 3.5–5.2)
SODIUM: 144 mmol/L (ref 134–144)
Total Protein: 6.5 g/dL (ref 6.0–8.5)

## 2017-10-08 LAB — HEPATITIS C ANTIBODY

## 2018-01-20 IMAGING — US US ABDOMEN LIMITED
1 series · 14 of 25 positions shown · non-contrast
Comparison: None in PACs

CLINICAL DATA: Two months of epigastric pain.

EXAM:
ULTRASOUND ABDOMEN LIMITED RIGHT UPPER QUADRANT

[Series 1: us abdomen limited · 0.19mm/px · 14 of 41 slices shown]
[im 1/41]
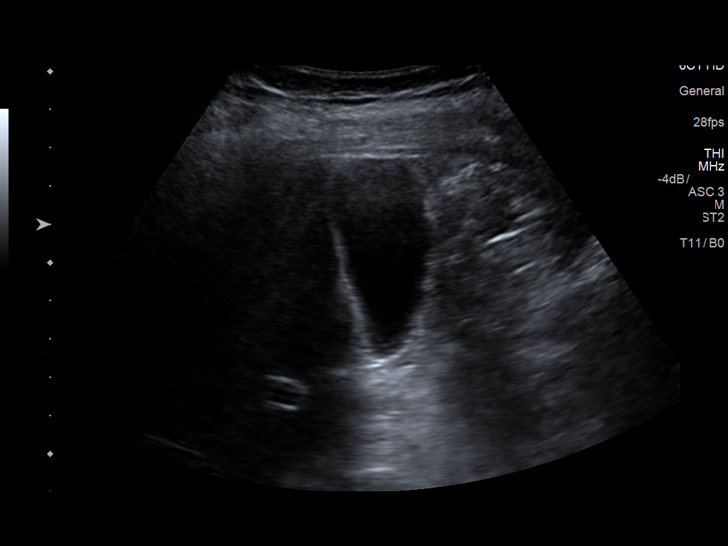
[im 4/41]
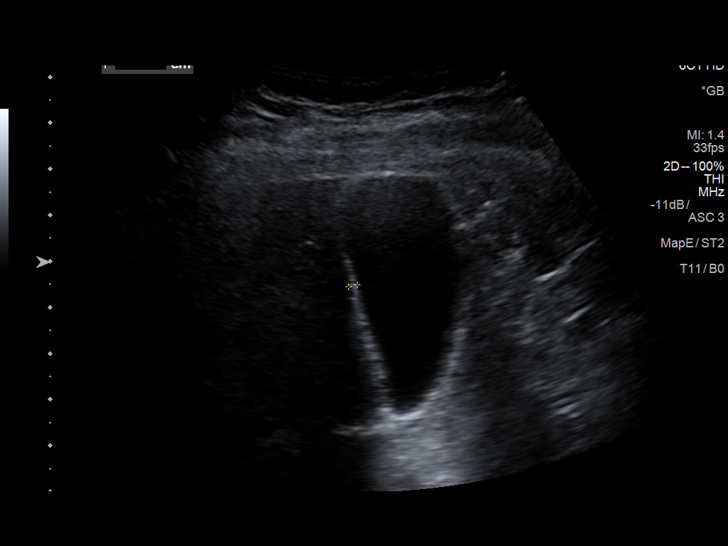
[im 7/41]
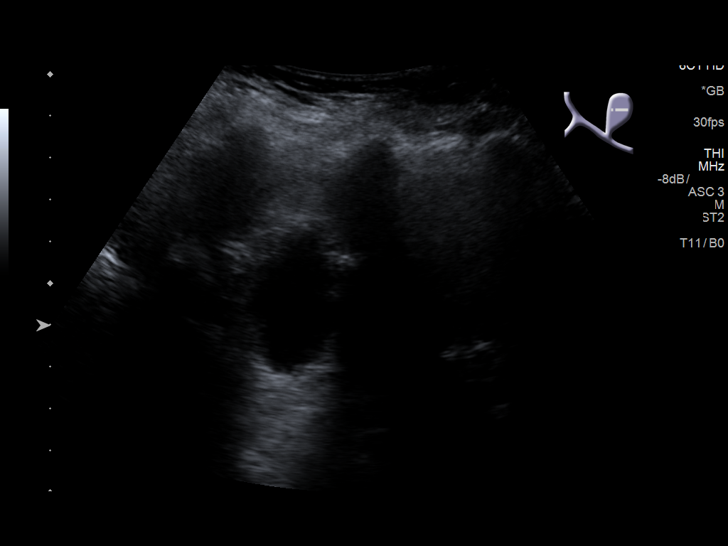
[im 11/41]
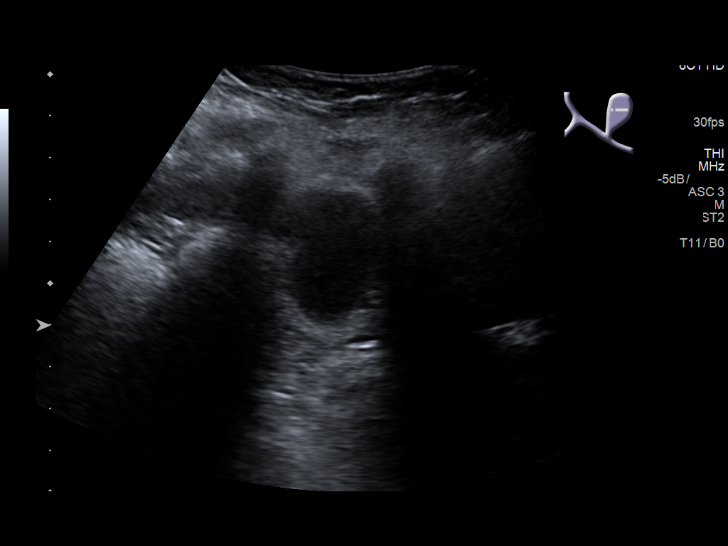
[im 14/41]
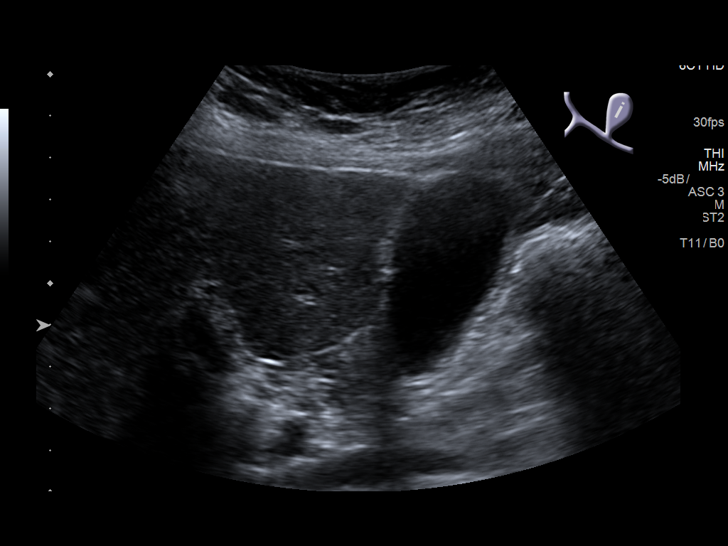
[im 16/41]
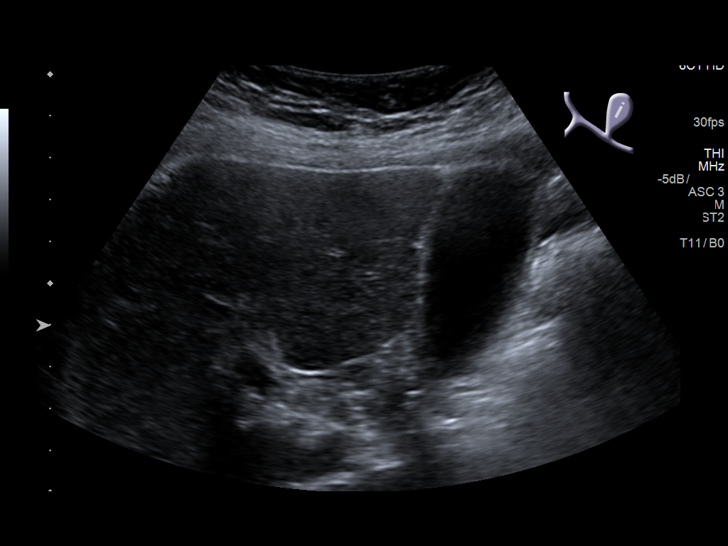
[im 19/41]
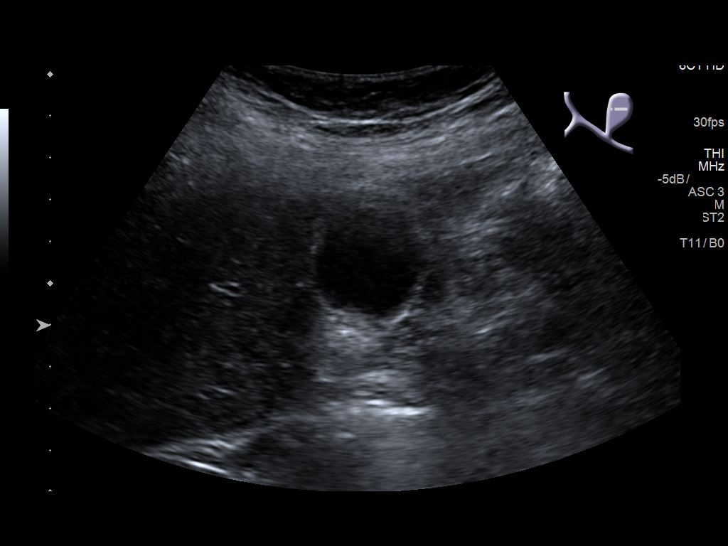
[im 22/41]
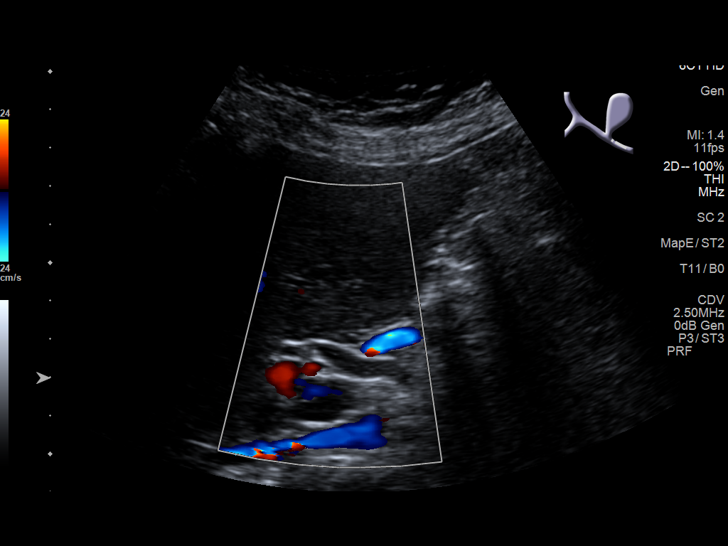
[im 26/41]
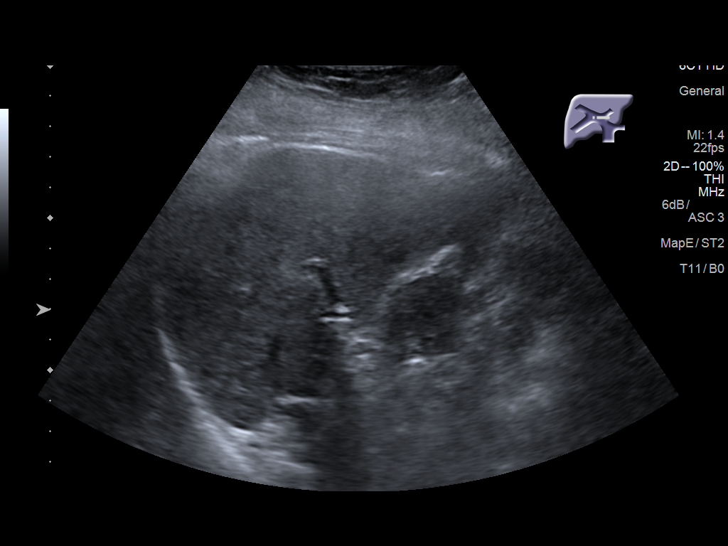
[im 27/41]
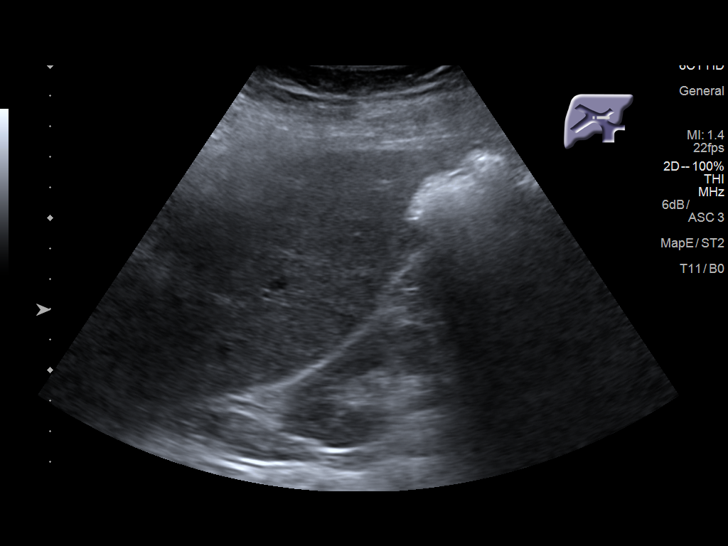
[im 31/41]
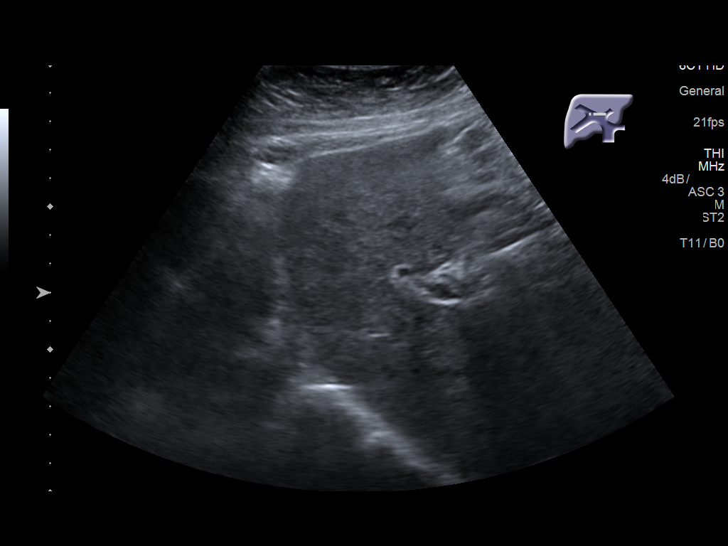
[im 34/41]
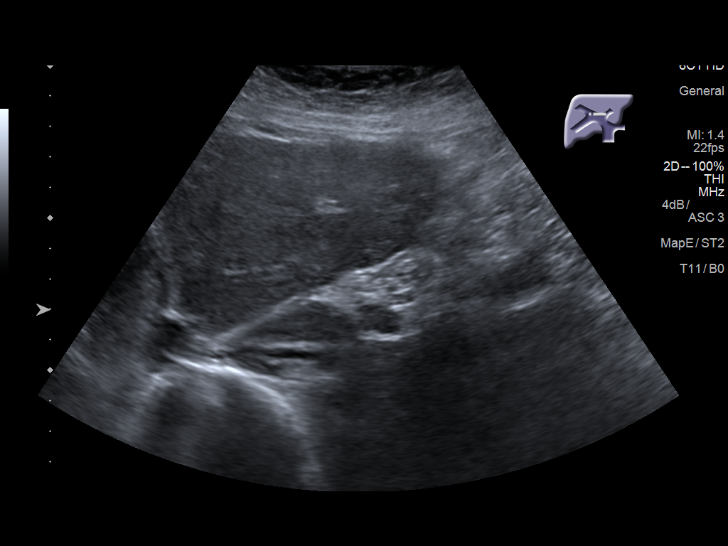
[im 37/41]
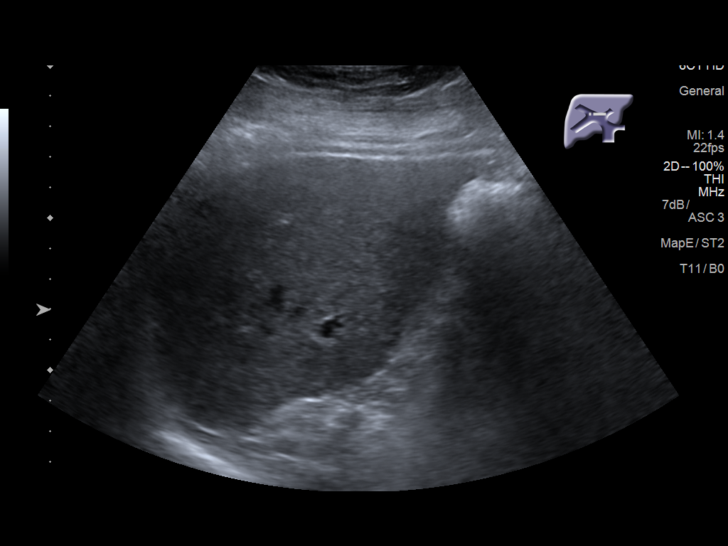
[im 41/41]
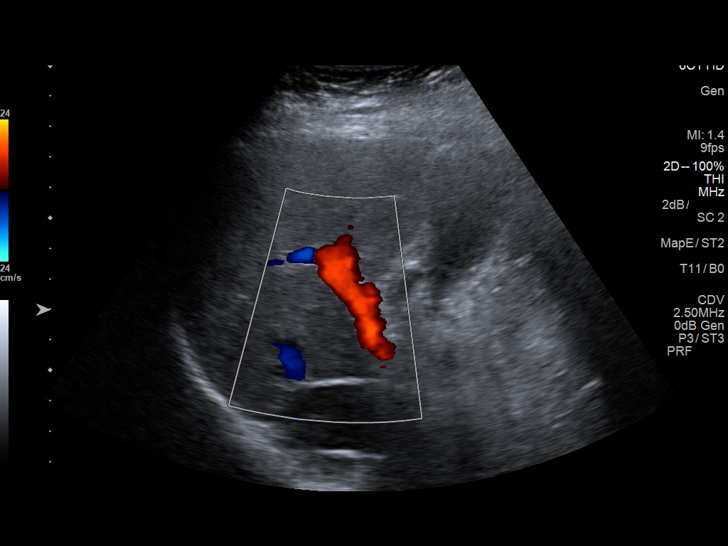

[14 of 25 positions shown; findings below may reference images not displayed]

FINDINGS: Gallbladder:

No gallstones or wall thickening visualized. No sonographic Murphy
sign noted by sonographer.

Common bile duct:

Diameter: 4.5 mm

Liver:

No focal lesion identified. Within normal limits in parenchymal
echogenicity. Portal vein is patent on color Doppler imaging with
normal direction of blood flow towards the liver.
IMPRESSION: Normal right upper quadrant ultrasound examination. If there are
clinical concerns of gallbladder dysfunction, a nuclear medicine
hepatobiliary scan with gallbladder ejection fraction determination
may be useful.

## 2018-01-28 ENCOUNTER — Other Ambulatory Visit: Payer: Self-pay | Admitting: Family Medicine

## 2018-01-28 DIAGNOSIS — F5101 Primary insomnia: Secondary | ICD-10-CM

## 2018-01-29 NOTE — Telephone Encounter (Signed)
Patient is requesting a refill of the following medications: Requested Prescriptions   Pending Prescriptions Disp Refills  . traZODone (DESYREL) 50 MG tablet [Pharmacy Med Name: TRAZODONE 50MG  TABLETS] 30 tablet 0    Sig: TAKE 1/2 TABLET BY MOUTH AT BEDTIME FOR 1 WEEK. INCREASE TO 1 TABLET AFTER THAT    Date of patient request: 01/28/18 Last office visit: 10/07/17 Date of last refill: 10/07/17 Last refill amount: 30 Follow up time period per chart: n/a  Please advise Dgaddy, CMA

## 2018-03-16 IMAGING — DX DG CHEST 2V
2 series · 2 of 2 positions shown · non-contrast
Comparison: Chest radiograph 08/27/2016

CLINICAL DATA: Productive cough

EXAM:
CHEST  2 VIEW

[chest pa]
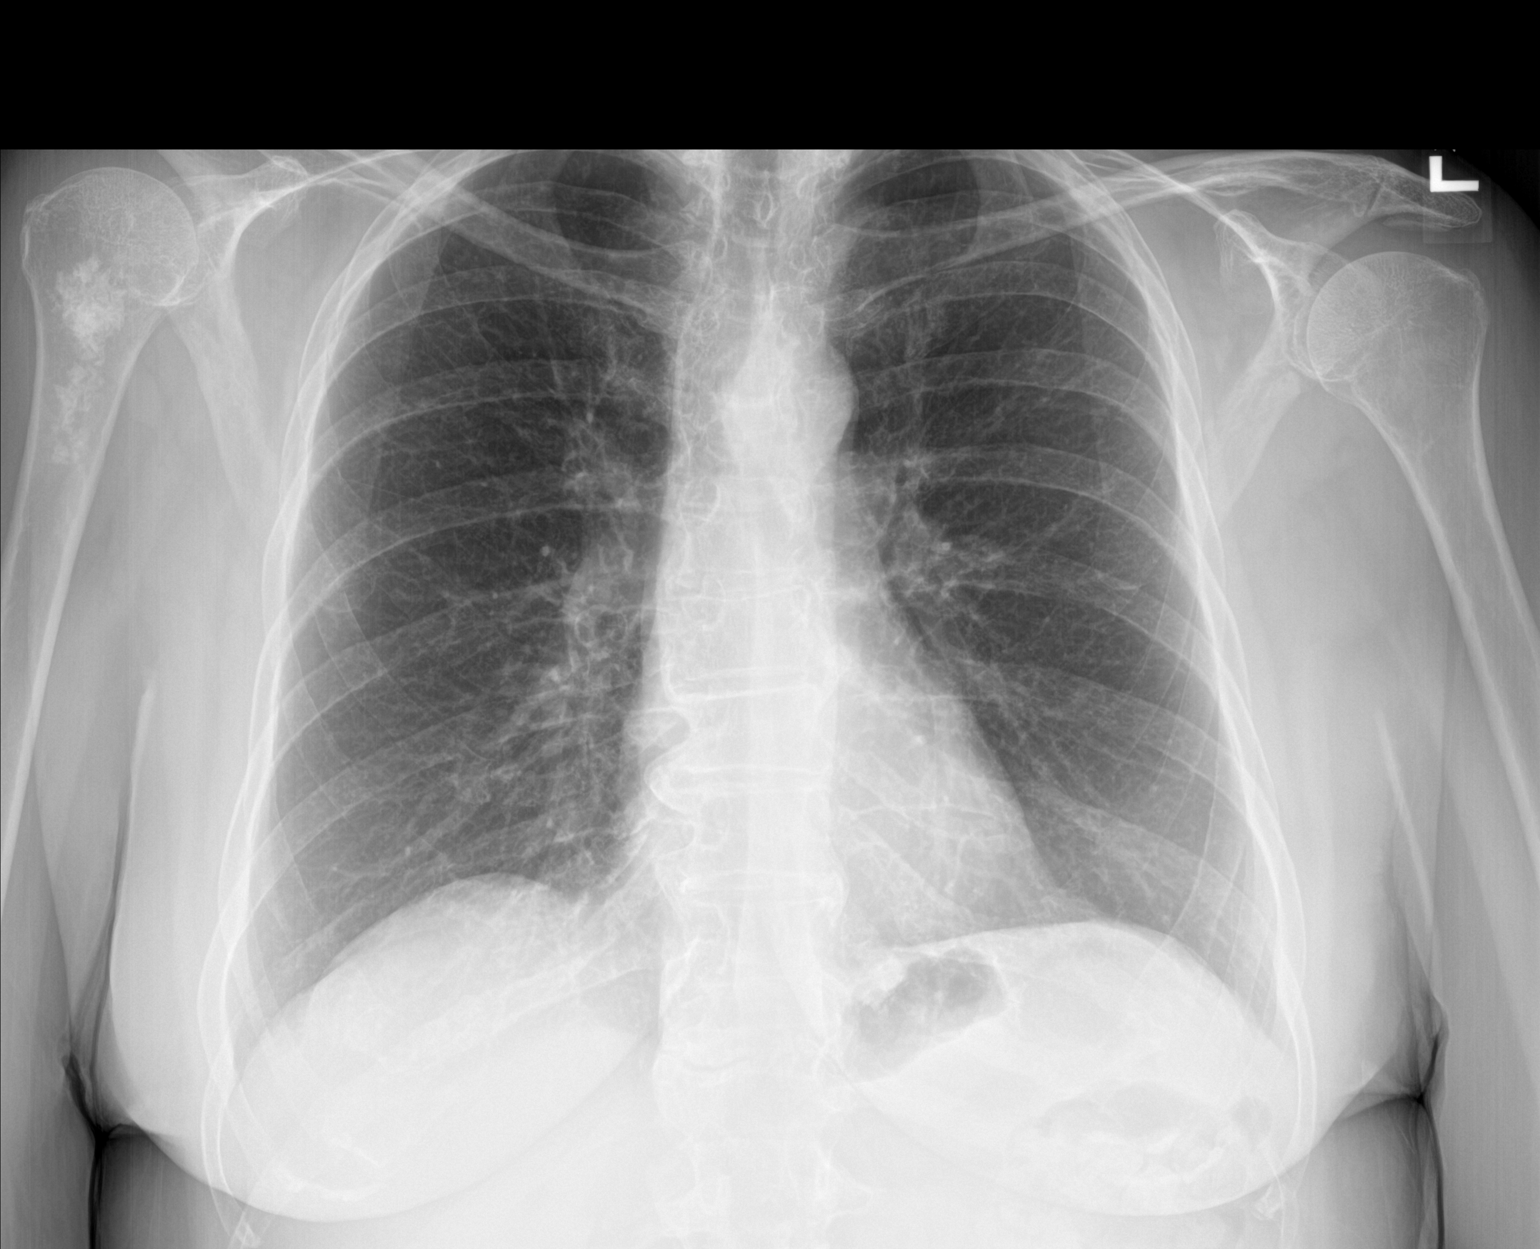

[chest lat]
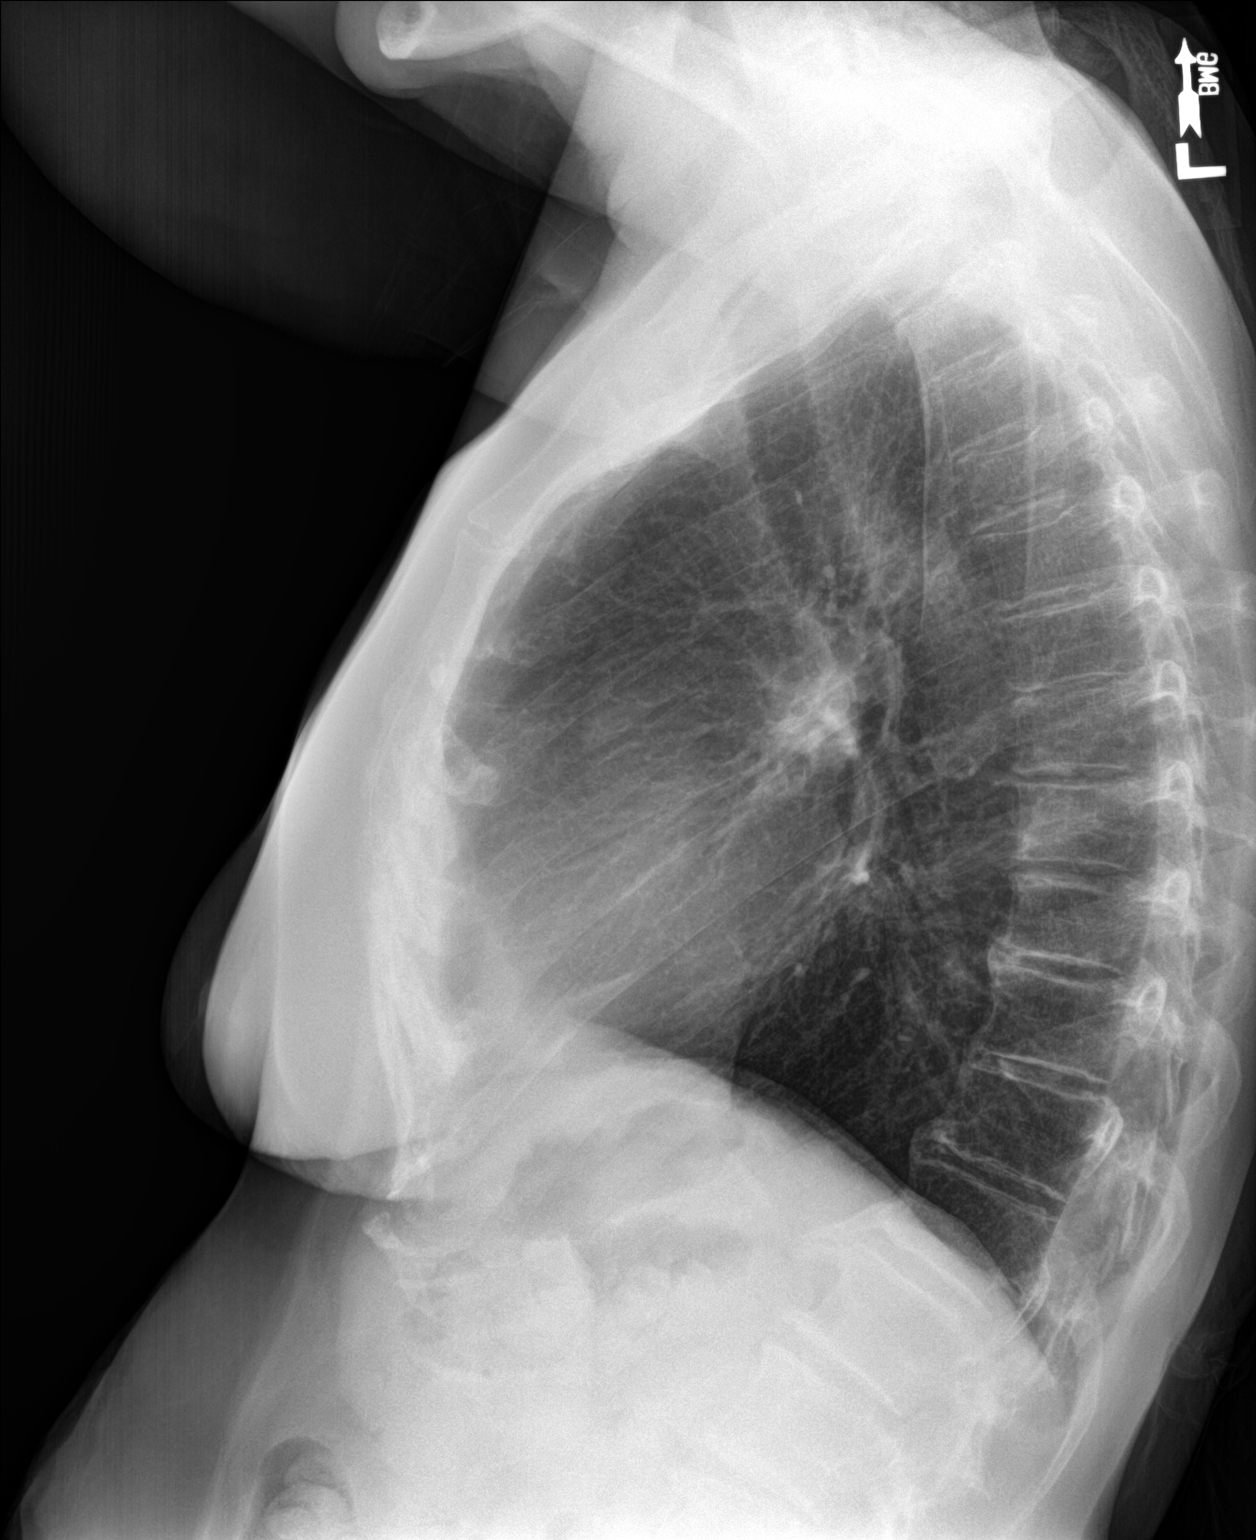

[2 of 2 positions shown; findings below may reference images not displayed]

FINDINGS: The heart size and mediastinal contours are within normal limits.
Both lungs are clear. Sclerotic area within the proximal right
humerus is likely an enchondroma.
IMPRESSION: No active cardiopulmonary disease.

## 2018-04-18 ENCOUNTER — Ambulatory Visit (INDEPENDENT_AMBULATORY_CARE_PROVIDER_SITE_OTHER): Payer: Medicare Other | Admitting: Family Medicine

## 2018-04-18 ENCOUNTER — Encounter: Payer: Self-pay | Admitting: Family Medicine

## 2018-04-18 ENCOUNTER — Other Ambulatory Visit: Payer: Self-pay

## 2018-04-18 VITALS — BP 134/77 | HR 67 | Temp 98.4°F | Resp 16 | Ht 65.0 in | Wt 175.8 lb

## 2018-04-18 DIAGNOSIS — F5101 Primary insomnia: Secondary | ICD-10-CM | POA: Diagnosis not present

## 2018-04-18 DIAGNOSIS — J309 Allergic rhinitis, unspecified: Secondary | ICD-10-CM | POA: Diagnosis not present

## 2018-04-18 DIAGNOSIS — I10 Essential (primary) hypertension: Secondary | ICD-10-CM

## 2018-04-18 MED ORDER — AZITHROMYCIN 250 MG PO TABS: ORAL_TABLET | ORAL | 0 refills | Status: DC

## 2018-04-18 MED ORDER — METHYLPREDNISOLONE ACETATE 80 MG/ML IJ SUSP
80.0000 mg | Freq: Once | INTRAMUSCULAR | Status: AC
  Administered 2018-04-18: 80 mg via INTRAMUSCULAR

## 2018-04-18 MED ORDER — TRAZODONE HCL 50 MG PO TABS: ORAL_TABLET | ORAL | 0 refills | Status: DC

## 2018-04-18 MED ORDER — FLUTICASONE PROPIONATE 50 MCG/ACT NA SUSP: 1.0000 | Freq: Two times a day (BID) | NASAL | 6 refills | Status: DC

## 2018-04-18 MED ORDER — LISINOPRIL-HYDROCHLOROTHIAZIDE 10-12.5 MG PO TABS
1.0000 | ORAL_TABLET | Freq: Every day | ORAL | 3 refills | Status: DC
Start: 2018-04-18 — End: 2018-07-31

## 2018-04-18 NOTE — Progress Notes (Signed)
Patient ID: Kelli Swanson, female    DOB: 1945/10/06  Age: 72 y.o. MRN: [id number]  Chief Complaint  Patient presents with  . ? uri/sinus infection    x 1 week sinus ha, intermittent st and laryngitis x couple days.  No fevers but pt temps usually run low so what is normal temp could ? temp for her.  Taking walfed pe /flonase for nasal congestion.  Lots of nasal congestion on last night.  Per pt had chills on last night-either she is running cold or either hot    Subjective:   Over a week of nasal congestion.  It started with a sore throat then went to her head she feels tight and stuffy.  Clear rhinorrhea.  No fever but she did have chills yesterday.  Does not smoke.  She requested refill of her blood pressure medicine and her trazodone for sleep.  Current allergies, medications, problem list, past/family and social histories reviewed.  Objective:  BP 134/77 (BP Location: Right Arm, Patient Position: Sitting, Cuff Size: Normal)   Pulse 67   Temp 98.4 F (36.9 C) (Oral)   Resp 16   Ht 5\' 5"  (1.651 m)   Wt 175 lb 12.8 oz (79.7 kg)   SpO2 98%   BMI 29.25 kg/m   TMs normal.  Nose congested.  Mild tenderness of the frontal and maxillary sinuses, not severe.  Throat pale, no edema.  No erythema.  Neck supple without significant nodes.  Chest clear.  Assessment & Plan:   Assessment: 1. Allergic rhinitis, unspecified seasonality, unspecified trigger   2. Essential hypertension   3. Primary insomnia       Plan: Instructions.  No orders of the defined types were placed in this encounter.   Meds ordered this encounter  Medications  . methylPREDNISolone acetate (DEPO-MEDROL) injection 80 mg  . fluticasone (FLONASE) 50 MCG/ACT nasal spray    Sig: Place 1 spray into both nostrils 2 (two) times daily.    Dispense:  16 g    Refill:  6  . lisinopril-hydrochlorothiazide (PRINZIDE,ZESTORETIC) 10-12.5 MG tablet    Sig: Take 1 tablet by mouth daily before breakfast.    Dispense:   90 tablet    Refill:  3  . traZODone (DESYREL) 50 MG tablet    Sig: TAKE 1/2 TABLET BY MOUTH AT BEDTIME FOR 1 WEEK. INCREASE TO 1 TABLET AFTER THAT    Dispense:  90 tablet    Refill:  0  . azithromycin (ZITHROMAX) 250 MG tablet    Sig: Take 2 tabs PO x 1 dose, then 1 tab PO QD x 4 days    Dispense:  6 tablet    Refill:  0    Do not fill after September 20.         Patient Instructions   Take plenty of fluids.  The better hydrated you are the thenar of the secretions are.  Over-the-counter Allegra (fexofenadine) or Claritin (loratadine) daily  Continue using the Flonase twice daily as instructed  I have refilled your trazodone and lisinopril  You have received a shot of Solu-Medrol 80 mg which is a cortisone to try and calm down the sinuses.  If you are getting worse, especially if you are bringing out green or yellow mucus, then go ahead and fill the antibiotic prescription and take it.  I gave you a azithromycin (Zithromax) which you take 2 pills initially, then 1 daily for the next 4 days.  Return or get rechecked as needed  if not improving.    If you have lab work done today you will be contacted with your lab results within the next 2 weeks.  If you have not heard from [place] then please contact us. The fastest way to get your results is to register for My Chart.   IF you received an x-ray today, you will receive an invoice from [facility]. Please contact [facility] at [phone] with questions or concerns regarding your invoice.   IF you received labwork today, you will receive an invoice from [facility]. Please contact [facility] at [phone] with questions or concerns regarding your invoice.   Our billing staff will not be able to assist you with questions regarding bills from these companies.  You will be contacted with the lab results as soon as they are available. The fastest way to get your results is to activate your My Chart account.  Instructions are located on the last page of this paperwork. If you have not heard from [place] regarding the results in 2 weeks, please contact this office.        No follow-ups on file.   Ruben Reason, MD 04/18/2018

## 2018-04-18 NOTE — Patient Instructions (Addendum)
Take plenty of fluids.  The better hydrated you are the thenar of the secretions are.  Over-the-counter Allegra (fexofenadine) or Claritin (loratadine) daily  Continue using the Flonase twice daily as instructed  I have refilled your trazodone and lisinopril  You have received a shot of Solu-Medrol 80 mg which is a cortisone to try and calm down the sinuses.  If you are getting worse, especially if you are bringing out green or yellow mucus, then go ahead and fill the antibiotic prescription and take it.  I gave you a azithromycin (Zithromax) which you take 2 pills initially, then 1 daily for the next 4 days.  Return or get rechecked as needed if not improving.    If you have lab work done today you will be contacted with your lab results within the next 2 weeks.  If you have not heard from [place] then please contact us. The fastest way to get your results is to register for My Chart.   IF you received an x-ray today, you will receive an invoice from [facility]. Please contact [facility] at [phone] with questions or concerns regarding your invoice.   IF you received labwork today, you will receive an invoice from [facility]. Please contact [facility] at [phone] with questions or concerns regarding your invoice.   Our billing staff will not be able to assist you with questions regarding bills from these companies.  You will be contacted with the lab results as soon as they are available. The fastest way to get your results is to activate your My Chart account. Instructions are located on the last page of this paperwork. If you have not heard from [place] regarding the results in 2 weeks, please contact this office.

## 2018-06-24 LAB — HM MAMMOGRAPHY: HM Mammogram: NORMAL (ref 0–4)

## 2018-07-25 ENCOUNTER — Encounter: Payer: Self-pay | Admitting: Family Medicine

## 2018-07-25 NOTE — Progress Notes (Signed)
Mammogram screening Impression: There is no mammographic evidence of malignancy. Routine mammographic evaluation in 1 yr is recommended(06/25/2019)

## 2018-07-31 ENCOUNTER — Encounter: Payer: Self-pay | Admitting: Emergency Medicine

## 2018-07-31 ENCOUNTER — Other Ambulatory Visit: Payer: Self-pay

## 2018-07-31 ENCOUNTER — Ambulatory Visit (INDEPENDENT_AMBULATORY_CARE_PROVIDER_SITE_OTHER): Payer: Medicare Other | Admitting: Emergency Medicine

## 2018-07-31 VITALS — BP 130/74 | HR 64 | Temp 98.3°F | Resp 16 | Ht 64.75 in | Wt 169.4 lb

## 2018-07-31 DIAGNOSIS — J0101 Acute recurrent maxillary sinusitis: Secondary | ICD-10-CM | POA: Diagnosis not present

## 2018-07-31 DIAGNOSIS — R0981 Nasal congestion: Secondary | ICD-10-CM

## 2018-07-31 DIAGNOSIS — I10 Essential (primary) hypertension: Secondary | ICD-10-CM

## 2018-07-31 MED ORDER — LISINOPRIL-HYDROCHLOROTHIAZIDE 10-12.5 MG PO TABS
1.0000 | ORAL_TABLET | Freq: Every day | ORAL | 3 refills | Status: DC
Start: 2018-07-31 — End: 2018-09-29

## 2018-07-31 MED ORDER — OLOPATADINE HCL 0.2 % OP SOLN: 1.0000 [drp] | Freq: Every day | OPHTHALMIC | 1 refills | Status: AC

## 2018-07-31 MED ORDER — GUAIFENESIN ER 600 MG PO TB12
600.0000 mg | ORAL_TABLET | Freq: Two times a day (BID) | ORAL | 0 refills | Status: AC
Start: 2018-07-31 — End: 2018-08-07

## 2018-07-31 MED ORDER — AZITHROMYCIN 250 MG PO TABS
ORAL_TABLET | ORAL | 0 refills | Status: DC
Start: 2018-07-31 — End: 2019-05-08
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
## 2018-07-31 NOTE — Patient Instructions (Addendum)
   If you have lab work done today you will be contacted with your lab results within the next 2 weeks.  If you have not heard from us then please contact us. The fastest way to get your results is to register for My Chart.   IF you received an x-ray today, you will receive an invoice from [facility]. Please contact [facility] at [phone] with questions or concerns regarding your invoice.   IF you received labwork today, you will receive an invoice from [facility]. Please contact [facility] at [phone] with questions or concerns regarding your invoice.   Our billing staff will not be able to assist you with questions regarding bills from these companies.  You will be contacted with the lab results as soon as they are available. The fastest way to get your results is to activate your My Chart account. Instructions are located on the last page of this paperwork. If you have not heard from us regarding the results in 2 weeks, please contact this office.     Sinusitis, Adult Sinusitis is soreness and swelling (inflammation) of your sinuses. Sinuses are hollow spaces in the bones around your face. They are located:  Around your eyes.  In the middle of your forehead.  Behind your nose.  In your cheekbones. Your sinuses and nasal passages are lined with a fluid called mucus. Mucus drains out of your sinuses. Swelling can trap mucus in your sinuses. This lets germs (bacteria, virus, or fungus) grow, which leads to infection. Most of the time, this condition is caused by a virus. What are the causes? This condition is caused by:  Allergies.  Asthma.  Germs.  Things that block your nose or sinuses.  Growths in the nose (nasal polyps).  Chemicals or irritants in the air.  Fungus (rare). What increases the risk? You are more likely to develop this condition if:  You have a weak body defense system (immune system).  You do a lot of swimming or  diving.  You use nasal sprays too much.  You smoke. What are the signs or symptoms? The main symptoms of this condition are pain and a feeling of pressure around the sinuses. Other symptoms include:  Stuffy nose (congestion).  Runny nose (drainage).  Swelling and warmth in the sinuses.  Headache.  Toothache.  A cough that may get worse at night.  Mucus that collects in the throat or the back of the nose (postnasal drip).  Being unable to smell and taste.  Being very tired (fatigue).  A fever.  Sore throat.  Bad breath. How is this diagnosed? This condition is diagnosed based on:  Your symptoms.  Your medical history.  A physical exam.  Tests to find out if your condition is short-term (acute) or long-term (chronic). Your doctor may: ? Check your nose for growths (polyps). ? Check your sinuses using a tool that has a light (endoscope). ? Check for allergies or germs. ? Do imaging tests, such as an MRI or CT scan. How is this treated? Treatment for this condition depends on the cause and whether it is short-term or long-term.  If caused by a virus, your symptoms should go away on their own within 10 days. You may be given medicines to relieve symptoms. They include: ? Medicines that shrink swollen tissue in the nose. ? Medicines that treat allergies (antihistamines). ? A spray that treats swelling of the nostrils. ? Rinses that help get rid of thick mucus in your   nose (nasal saline washes).  If caused by bacteria, your doctor may wait to see if you will get better without treatment. You may be given antibiotic medicine if you have: ? A very bad infection. ? A weak body defense system.  If caused by growths in the nose, you may need to have surgery. Follow these instructions at home: Medicines  Take, use, or apply over-the-counter and prescription medicines only as told by your doctor. These may include nasal sprays.  If you were prescribed an antibiotic  medicine, take it as told by your doctor. Do not stop taking the antibiotic even if you start to feel better. Hydrate and humidify   Drink enough water to keep your pee (urine) pale yellow.  Use a cool mist humidifier to keep the humidity level in your home above 50%.  Breathe in steam for 10-15 minutes, 3-4 times a day, or as told by your doctor. You can do this in the bathroom while a hot shower is running.  Try not to spend time in cool or dry air. Rest  Rest as much as you can.  Sleep with your head raised (elevated).  Make sure you get enough sleep each night. General instructions   Put a warm, moist washcloth on your face 3-4 times a day, or as often as told by your doctor. This will help with discomfort.  Wash your hands often with soap and water. If there is no soap and water, use hand sanitizer.  Do not smoke. Avoid being around people who are smoking (secondhand smoke).  Keep all follow-up visits as told by your doctor. This is important. Contact a doctor if:  You have a fever.  Your symptoms get worse.  Your symptoms do not get better within 10 days. Get help right away if:  You have a very bad headache.  You cannot stop throwing up (vomiting).  You have very bad pain or swelling around your face or eyes.  You have trouble seeing.  You feel confused.  Your neck is stiff.  You have trouble breathing. Summary  Sinusitis is swelling of your sinuses. Sinuses are hollow spaces in the bones around your face.  This condition is caused by tissues in your nose that become inflamed or swollen. This traps germs. These can lead to infection.  If you were prescribed an antibiotic medicine, take it as told by your doctor. Do not stop taking it even if you start to feel better.  Keep all follow-up visits as told by your doctor. This is important. This information is not intended to replace advice given to you by your health care provider. Make sure you discuss  any questions you have with your health care provider. Document Released: 01/16/2008 Document Revised: 12/30/2017 Document Reviewed: 12/30/2017 Elsevier Interactive Patient Education  2019 Elsevier Inc.  

## 2018-09-24 ENCOUNTER — Telehealth: Payer: Self-pay | Admitting: Family Medicine

## 2018-09-24 NOTE — Telephone Encounter (Unsigned)
Copied from [place] [id number]. Topic: Appointment Scheduling - Scheduling Inquiry for Clinic >> Sep 24, 2018  9:20 AM Kelli Swanson wrote: Reason for CRM: Patient would like to be worked in sooner than April for an appt with Dr Nolon Rod because she is concerned because she has been having low blood pressure, her left ear is hurting for the last 3 days and she has lost weight. This morning her reading for her blood pressure was 98/48

## 2018-09-25 NOTE — Telephone Encounter (Signed)
I called pt. Pt states that she is willing to see another provider if Dr. Bridget Hartshorn is not available. I transferred call to front desk.

## 2018-09-29 ENCOUNTER — Encounter: Payer: Self-pay | Admitting: Family Medicine

## 2018-09-29 ENCOUNTER — Other Ambulatory Visit: Payer: Self-pay

## 2018-09-29 ENCOUNTER — Other Ambulatory Visit: Payer: Self-pay | Admitting: Family Medicine

## 2018-09-29 ENCOUNTER — Ambulatory Visit (INDEPENDENT_AMBULATORY_CARE_PROVIDER_SITE_OTHER): Payer: Medicare Other | Admitting: Family Medicine

## 2018-09-29 VITALS — BP 145/77 | HR 61 | Temp 98.0°F | Resp 17 | Ht 64.75 in | Wt 163.2 lb

## 2018-09-29 DIAGNOSIS — I10 Essential (primary) hypertension: Secondary | ICD-10-CM | POA: Diagnosis not present

## 2018-09-29 DIAGNOSIS — F419 Anxiety disorder, unspecified: Secondary | ICD-10-CM | POA: Diagnosis not present

## 2018-09-29 DIAGNOSIS — R609 Edema, unspecified: Secondary | ICD-10-CM

## 2018-09-29 DIAGNOSIS — F5101 Primary insomnia: Secondary | ICD-10-CM | POA: Diagnosis not present

## 2018-09-29 MED ORDER — HYDROCHLOROTHIAZIDE 12.5 MG PO TABS
12.5000 mg | ORAL_TABLET | Freq: Every day | ORAL | 1 refills | Status: DC
Start: 2018-09-29 — End: 2018-11-12

## 2018-09-29 MED ORDER — TRAZODONE HCL 50 MG PO TABS: ORAL_TABLET | ORAL | 3 refills | Status: DC

## 2018-09-29 NOTE — Patient Instructions (Addendum)
Goal for patients over 65 who do not have heart disease is a blood pressure less than 150/90    If you have lab work done today you will be contacted with your lab results within the next 2 weeks.  If you have not heard from [place] then please contact us. The fastest way to get your results is to register for My Chart.   IF you received an x-ray today, you will receive an invoice from [facility]. Please contact [facility] at [phone] with questions or concerns regarding your invoice.   IF you received labwork today, you will receive an invoice from [facility]. Please contact [facility] at [phone] with questions or concerns regarding your invoice.   Our billing staff will not be able to assist you with questions regarding bills from these companies.  You will be contacted with the lab results as soon as they are available. The fastest way to get your results is to activate your My Chart account. Instructions are located on the last page of this paperwork. If you have not heard from [place] regarding the results in 2 weeks, please contact this office.
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
## 2018-11-12 ENCOUNTER — Other Ambulatory Visit: Payer: Self-pay | Admitting: Family Medicine

## 2018-11-12 DIAGNOSIS — R609 Edema, unspecified: Secondary | ICD-10-CM

## 2018-11-12 NOTE — Telephone Encounter (Signed)
Requested medication (s) are due for refill today -yes  Requested medication (s) are on the active medication list -yes  Future visit scheduled -yes  Last refill: 09/29/18  Notes to clinic: Patient is past due labs- fails lab criteria for RF- upcoming appointment scheduled for May- sent for PCP review for refill request   Requested Prescriptions  Pending Prescriptions Disp Refills   hydrochlorothiazide (HYDRODIURIL) 12.5 MG tablet [Pharmacy Med Name: HYDROCHLOROTHIAZIDE 12.5MG  TABLETS] 30 tablet 1    Sig: TAKE 1 TABLET BY MOUTH DAILY FOR PERIPHERAL PAIN     Cardiovascular: Diuretics - Thiazide Failed - 11/12/2018  9:13 AM      Failed - Ca in normal range and within 360 days    Calcium  Date Value Ref Range Status  10/07/2017 9.5 8.7 - 10.3 mg/dL Final         Failed - Cr in normal range and within 360 days    Creat  Date Value Ref Range Status  04/27/2013 1.08 0.50 - 1.10 mg/dL Final   Creatinine, Ser  Date Value Ref Range Status  10/07/2017 0.95 0.57 - 1.00 mg/dL Final         Failed - K in normal range and within 360 days    Potassium  Date Value Ref Range Status  10/07/2017 4.5 3.5 - 5.2 mmol/L Final         Failed - Na in normal range and within 360 days    Sodium  Date Value Ref Range Status  10/07/2017 144 134 - 144 mmol/L Final         Failed - Last BP in normal range    BP Readings from Last 1 Encounters:  09/29/18 (!) 145/77         Passed - Valid encounter within last 6 months    Recent Outpatient Visits          1 month ago Peripheral edema   Primary Care at [facility], Arlie Solomons, MD   3 months ago Acute recurrent maxillary sinusitis   Primary Care at [facility], Ines Bloomer, MD   6 months ago Allergic rhinitis, unspecified seasonality, unspecified trigger   Primary Care at [facility], Fenton Malling, MD   1 year ago Primary insomnia   Primary Care at [facility], [place] A, MD   1 year ago Allergic rhinitis, unspecified seasonality,  unspecified trigger   Primary [facility] at [facility], Lilia Argue, MD      Future Appointments            In 1 month Forrest Moron, MD Primary Care at [place], [facility]            Requested Prescriptions  Pending Prescriptions Disp Refills   hydrochlorothiazide (HYDRODIURIL) 12.5 MG tablet [Pharmacy Med Name: HYDROCHLOROTHIAZIDE 12.5MG  TABLETS] 30 tablet 1    Sig: TAKE 1 TABLET BY MOUTH DAILY FOR PERIPHERAL PAIN     Cardiovascular: Diuretics - Thiazide Failed - 11/12/2018  9:13 AM      Failed - Ca in normal range and within 360 days    Calcium  Date Value Ref Range Status  10/07/2017 9.5 8.7 - 10.3 mg/dL Final         Failed - Cr in normal range and within 360 days    Creat  Date Value Ref Range Status  04/27/2013 1.08 0.50 - 1.10 mg/dL Final   Creatinine, Ser  Date Value Ref Range Status  10/07/2017 0.95 0.57 - 1.00 mg/dL Final  Failed - K in normal range and within 360 days    Potassium  Date Value Ref Range Status  10/07/2017 4.5 3.5 - 5.2 mmol/L Final         Failed - Na in normal range and within 360 days    Sodium  Date Value Ref Range Status  10/07/2017 144 134 - 144 mmol/L Final         Failed - Last BP in normal range    BP Readings from Last 1 Encounters:  09/29/18 (!) 145/77         Passed - Valid encounter within last 6 months    Recent Outpatient Visits          1 month ago Peripheral edema   Primary Care at [facility], [place] A, MD   3 months ago Acute recurrent maxillary sinusitis   Primary Care at [facility], Ines Bloomer, MD   6 months ago Allergic rhinitis, unspecified seasonality, unspecified trigger   Primary Care at [facility], Fenton Malling, MD   1 year ago Primary insomnia   Primary Care at [facility], Arlie Solomons, MD   1 year ago Allergic rhinitis, unspecified seasonality, unspecified trigger   Primary Care at [facility], Lilia Argue, MD      Future Appointments            In 1 month Forrest Moron, MD Primary Care at  [place], [facility]

## 2018-12-11 ENCOUNTER — Other Ambulatory Visit: Payer: Self-pay | Admitting: Family Medicine

## 2018-12-11 DIAGNOSIS — R609 Edema, unspecified: Secondary | ICD-10-CM

## 2018-12-11 NOTE — Telephone Encounter (Signed)
Requested Prescriptions  Pending Prescriptions Disp Refills  . hydrochlorothiazide (HYDRODIURIL) 12.5 MG tablet [Pharmacy Med Name: HYDROCHLOROTHIAZIDE 12.5MG  TABLETS] 90 tablet 1    Sig: TAKE 1 TABLET BY MOUTH DAILY FOR PERIPHERAL PAIN     Cardiovascular: Diuretics - Thiazide Failed - 12/11/2018 11:48 AM      Failed - Ca in normal range and within 360 days    Calcium  Date Value Ref Range Status  10/07/2017 9.5 8.7 - 10.3 mg/dL Final         Failed - Cr in normal range and within 360 days    Creat  Date Value Ref Range Status  04/27/2013 1.08 0.50 - 1.10 mg/dL Final   Creatinine, Ser  Date Value Ref Range Status  10/07/2017 0.95 0.57 - 1.00 mg/dL Final         Failed - K in normal range and within 360 days    Potassium  Date Value Ref Range Status  10/07/2017 4.5 3.5 - 5.2 mmol/L Final         Failed - Na in normal range and within 360 days    Sodium  Date Value Ref Range Status  10/07/2017 144 134 - 144 mmol/L Final         Failed - Last BP in normal range    BP Readings from Last 1 Encounters:  09/29/18 (!) 145/77         Passed - Valid encounter within last 6 months    Recent Outpatient Visits          2 months ago Peripheral edema   Primary Care at [place], MD   4 months ago Acute recurrent maxillary sinusitis   Primary Care at [facility], Ines Bloomer, MD   7 months ago Allergic rhinitis, unspecified seasonality, unspecified trigger   Primary Care at [facility], Fenton Malling, MD   1 year ago Primary insomnia   Primary Care at [facility] Arlie Solomons, MD   1 year ago Allergic rhinitis, unspecified seasonality, unspecified trigger   Primary Care at [facility], Lilia Argue, MD      Future Appointments            In 1 week Forrest Moron, MD Primary Care at [place], [facility]

## 2018-12-12 ENCOUNTER — Other Ambulatory Visit: Payer: Self-pay

## 2018-12-12 DIAGNOSIS — I10 Essential (primary) hypertension: Secondary | ICD-10-CM

## 2018-12-12 DIAGNOSIS — E785 Hyperlipidemia, unspecified: Secondary | ICD-10-CM

## 2018-12-22 ENCOUNTER — Telehealth (INDEPENDENT_AMBULATORY_CARE_PROVIDER_SITE_OTHER): Payer: Medicare Other | Admitting: Family Medicine

## 2018-12-22 ENCOUNTER — Encounter: Payer: Self-pay | Admitting: Family Medicine

## 2018-12-22 ENCOUNTER — Other Ambulatory Visit: Payer: Self-pay

## 2018-12-22 VITALS — BP 106/62 | Wt 160.8 lb

## 2018-12-22 DIAGNOSIS — R609 Edema, unspecified: Secondary | ICD-10-CM

## 2018-12-22 DIAGNOSIS — R600 Localized edema: Secondary | ICD-10-CM

## 2018-12-22 DIAGNOSIS — I10 Essential (primary) hypertension: Secondary | ICD-10-CM

## 2018-12-22 MED ORDER — HYDROCHLOROTHIAZIDE 12.5 MG PO TABS: 12.5000 mg | ORAL_TABLET | Freq: Every day | ORAL | 1 refills | Status: DC | PRN

## 2018-12-22 MED ORDER — HYDROCHLOROTHIAZIDE 12.5 MG PO TABS: ORAL_TABLET | ORAL | 1 refills | Status: DC

## 2018-12-22 NOTE — Patient Instructions (Signed)
° ° ° °  If you have lab work done today you will be contacted with your lab results within the next 2 weeks.  If you have not heard from us then please contact us. The fastest way to get your results is to register for My Chart. ° ° °IF you received an x-ray today, you will receive an invoice from [facility]. Please contact [facility] at [phone] with questions or concerns regarding your invoice.  ° °IF you received labwork today, you will receive an invoice from [facility]. Please contact [facility] at [phone] with questions or concerns regarding your invoice.  ° °Our billing staff will not be able to assist you with questions regarding bills from these companies. ° °You will be contacted with the lab results as soon as they are available. The fastest way to get your results is to activate your My Chart account. Instructions are located on the last page of this paperwork. If you have not heard from us regarding the results in 2 weeks, please contact this office. °  ° ° ° °

## 2018-12-22 NOTE — Progress Notes (Signed)
CC: 3 month recheck blood pressure.  Needs refill on hctz, no other concerns.  No travel outside the [place] or [place] in the past 3 weeks.
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
## 2019-01-19 ENCOUNTER — Other Ambulatory Visit: Payer: Self-pay

## 2019-01-19 ENCOUNTER — Encounter: Payer: Self-pay | Admitting: Family Medicine

## 2019-01-19 ENCOUNTER — Ambulatory Visit (INDEPENDENT_AMBULATORY_CARE_PROVIDER_SITE_OTHER): Payer: Medicare Other | Admitting: Family Medicine

## 2019-01-19 VITALS — BP 128/70 | HR 60 | Temp 98.5°F | Resp 16 | Ht 64.75 in | Wt 164.4 lb

## 2019-01-19 DIAGNOSIS — M8000XS Age-related osteoporosis with current pathological fracture, unspecified site, sequela: Secondary | ICD-10-CM | POA: Diagnosis not present

## 2019-01-19 DIAGNOSIS — I10 Essential (primary) hypertension: Secondary | ICD-10-CM | POA: Diagnosis not present

## 2019-01-19 DIAGNOSIS — F5101 Primary insomnia: Secondary | ICD-10-CM

## 2019-01-19 DIAGNOSIS — G894 Chronic pain syndrome: Secondary | ICD-10-CM

## 2019-01-19 DIAGNOSIS — E2839 Other primary ovarian failure: Secondary | ICD-10-CM

## 2019-01-19 NOTE — Patient Instructions (Addendum)
If you have lab work done today you will be contacted with your lab results within the next 2 weeks.  If you have not heard from [place] then please contact us. The fastest way to get your results is to register for My Chart.   IF you received an x-ray today, you will receive an invoice from [facility]. Please contact [facility] at [phone] with questions or concerns regarding your invoice.   IF you received labwork today, you will receive an invoice from [place]. Please contact [facility] at [phone] with questions or concerns regarding your invoice.   Our billing staff will not be able to assist you with questions regarding bills from these companies.  You will be contacted with the lab results as soon as they are available. The fastest way to get your results is to activate your My Chart account. Instructions are located on the last page of this paperwork. If you have not heard from [place] regarding the results in 2 weeks, please contact this office.     Bone Health Bones protect organs, store calcium, anchor muscles, and support the whole body. Keeping your bones strong is important, especially as you get older. You can take actions to help keep your bones strong and healthy. Why is keeping my bones healthy important?  Keeping your bones healthy is important because your body constantly replaces bone cells. Cells get old, and new cells take their place. As we age, we lose bone cells because the body may not be able to make enough new cells to replace the old cells. The amount of bone cells and bone tissue you have is referred to as bone mass. The higher your bone mass, the stronger your bones. The aging process leads to an overall loss of bone mass in the body, which can increase the likelihood of:  Joint pain and stiffness.  Broken bones.  A condition in which the bones become weak and brittle (osteoporosis). A large decline in bone mass occurs in older adults.  In women, it occurs about the time of menopause. What actions can I take to keep my bones healthy? Good health habits are important for maintaining healthy bones. This includes eating nutritious foods and exercising regularly. To have healthy bones, you need to get enough of the right minerals and vitamins. Most nutrition experts recommend getting these nutrients from the foods that you eat. In some cases, taking supplements may also be recommended. Doing certain types of exercise is also important for bone health. What are the nutritional recommendations for healthy bones?  Eating a well-balanced diet with plenty of calcium and vitamin D will help to protect your bones. Nutritional recommendations vary from person to person. Ask your health care provider what is healthy for you. Here are some general guidelines. Get enough calcium Calcium is the most important (essential) mineral for bone health. Most people can get enough calcium from their diet, but supplements may be recommended for people who are at risk for osteoporosis. Good sources of calcium include:  Dairy products, such as low-fat or nonfat milk, cheese, and yogurt.  Dark green leafy vegetables, such as bok choy and broccoli.  Calcium-fortified foods, such as orange juice, cereal, bread, soy beverages, and tofu products.  Nuts, such as almonds. Follow these recommended amounts for daily calcium intake:  Children, age 102-3: 700 mg.  Children, age 70-8: 1,000 mg.  Children, age 34-13: 1,300 mg.  Teens, age 53-18: 1,300 mg.  Adults, age 79-50: 29,000  mg.  Adults, age 82-70: ? Men: 1,000 mg. ? Women: 1,200 mg.  Adults, age 55 or older: 1,200 mg.  Pregnant and breastfeeding females: ? Teens: 1,300 mg. ? Adults: 1,000 mg. Get enough vitamin D Vitamin D is the most essential vitamin for bone health. It helps the body absorb calcium. Sunlight stimulates the skin to make vitamin D, so be sure to get enough sunlight. If you live in  a cold climate or you do not get outside often, your health care provider may recommend that you take vitamin D supplements. Good sources of vitamin D in your diet include:  Egg yolks.  Saltwater fish.  Milk and cereal fortified with vitamin D. Follow these recommended amounts for daily vitamin D intake:  Children and teens, age 29-18: 600 international units.  Adults, age 28 or younger: 400-800 international units.  Adults, age 78 or older: 800-1,000 international units. Get other important nutrients Other nutrients that are important for bone health include:  Phosphorus. This mineral is found in meat, poultry, dairy foods, nuts, and legumes. The recommended daily intake for adult men and adult women is 700 mg.  Magnesium. This mineral is found in seeds, nuts, dark green vegetables, and legumes. The recommended daily intake for adult men is 400-420 mg. For adult women, it is 310-320 mg.  Vitamin K. This vitamin is found in green leafy vegetables. The recommended daily intake is 120 mg for adult men and 90 mg for adult women. What type of physical activity is best for building and maintaining healthy bones? Weight-bearing and strength-building activities are important for building and maintaining healthy bones. Weight-bearing activities cause muscles and bones to work against gravity. Strength-building activities increase the strength of the muscles that support bones. Weight-bearing and muscle-building activities include:  Walking and hiking.  Jogging and running.  Dancing.  Gym exercises.  Lifting weights.  Tennis and racquetball.  Climbing stairs.  Aerobics. Adults should get at least 30 minutes of moderate physical activity on most days. Children should get at least 60 minutes of moderate physical activity on most days. Ask your health care provider what type of exercise is best for you. How can I find out if my bone mass is low? Bone mass can be measured with an X-ray  test called a bone mineral density (BMD) test. This test is recommended for all women who are age 71 or older. It may also be recommended for:  Men who are age 90 or older.  People who are at risk for osteoporosis because of: ? Having bones that break easily. ? Having a long-term disease that weakens bones, such as kidney disease or rheumatoid arthritis. ? Having menopause earlier than normal. ? Taking medicine that weakens bones, such as steroids, thyroid hormones, or hormone treatment for breast cancer or prostate cancer. ? Smoking. ? Drinking three or more alcoholic drinks a day. If you find that you have a low bone mass, you may be able to prevent osteoporosis or further bone loss by changing your diet and lifestyle. Where can I find more information? For more information, check out the following websites:  [employer]: AviationTales.fr  [employer] of Health: www.bones.SouthExposed.es  International Osteoporosis Foundation: Administrator.iofbonehealth.org Summary  The aging process leads to an overall loss of bone mass in the body, which can increase the likelihood of broken bones and osteoporosis.  Eating a well-balanced diet with plenty of calcium and vitamin D will help to protect your bones.  Weight-bearing and strength-building activities are  also important for building and maintaining strong bones.  Bone mass can be measured with an X-ray test called a bone mineral density (BMD) test. This information is not intended to replace advice given to you by your health care provider. Make sure you discuss any questions you have with your health care provider. Document Released: 10/20/2003 Document Revised: 08/26/2017 Document Reviewed: 08/26/2017 Elsevier Interactive Patient Education  2019 [employer].
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
## 2019-01-20 LAB — CMP14+EGFR
ALT: 10 IU/L (ref 0–32)
AST: 20 IU/L (ref 0–40)
Albumin/Globulin Ratio: 1.7 (ref 1.2–2.2)
Albumin: 4 g/dL (ref 3.7–4.7)
Alkaline Phosphatase: 80 IU/L (ref 39–117)
BUN/Creatinine Ratio: 18 (ref 12–28)
BUN: 19 mg/dL (ref 8–27)
Bilirubin Total: 0.4 mg/dL (ref 0.0–1.2)
CO2: 24 mmol/L (ref 20–29)
Calcium: 9.6 mg/dL (ref 8.7–10.3)
Chloride: 102 mmol/L (ref 96–106)
Creatinine, Ser: 1.05 mg/dL — ABNORMAL HIGH (ref 0.57–1.00)
GFR calc Af Amer: 60 mL/min/{1.73_m2} (ref >59)
GFR calc non Af Amer: 52 mL/min/{1.73_m2} — ABNORMAL LOW (ref >59)
Globulin, Total: 2.4 g/dL (ref 1.5–4.5)
Glucose: 89 mg/dL (ref 65–99)
Potassium: 4.4 mmol/L (ref 3.5–5.2)
Sodium: 140 mmol/L (ref 134–144)
Total Protein: 6.4 g/dL (ref 6.0–8.5)

## 2019-01-20 LAB — LIPID PANEL
Chol/HDL Ratio: 2.5 ratio (ref 0.0–4.4)
Cholesterol, Total: 232 mg/dL — ABNORMAL HIGH (ref 100–199)
HDL: 91 mg/dL (ref >39)
LDL Calculated: 122 mg/dL — ABNORMAL HIGH (ref 0–99)
Triglycerides: 97 mg/dL (ref 0–149)
VLDL Cholesterol Cal: 19 mg/dL (ref 5–40)

## 2019-01-20 LAB — MICROALBUMIN, URINE: Microalbumin, Urine: <3 ug/mL

## 2019-01-20 LAB — VITAMIN D 25 HYDROXY (VIT D DEFICIENCY, FRACTURES): Vit D, 25-Hydroxy: 24.8 ng/mL — ABNORMAL LOW (ref 30.0–100.0)

## 2019-02-09 ENCOUNTER — Encounter: Payer: Self-pay | Admitting: Physician Assistant

## 2019-02-09 ENCOUNTER — Telehealth: Payer: Medicare Other | Admitting: Physician Assistant

## 2019-02-09 DIAGNOSIS — M545 Low back pain, unspecified: Secondary | ICD-10-CM

## 2019-02-09 MED ORDER — CYCLOBENZAPRINE HCL 10 MG PO TABS: 10.0000 mg | ORAL_TABLET | Freq: Three times a day (TID) | ORAL | 0 refills | Status: DC | PRN

## 2019-02-09 NOTE — Progress Notes (Signed)
We are sorry that you are not feeling well.  Here is how we plan to help!  Based on what you have shared with me it looks like you mostly have acute back pain.  Acute back pain is defined as musculoskeletal pain that can resolve in 1-3 weeks with conservative treatment.  I have prescribed  Flexeril 10 mg every eight hours as needed which is a muscle relaxer. In addition, I would recommend taking Tylenol 500 mg two pills three times daily as needed.  Some patients experience stomach irritation or in increased heartburn with anti-inflammatory drugs.  Please keep in mind that muscle relaxer's can cause fatigue and should not be taken while at work or driving.  Back pain is very common.  The pain often gets better over time.  The cause of back pain is usually not dangerous.  Most people can learn to manage their back pain on their own.  Home Care  Stay active.  Start with short walks on flat ground if you can.  Try to walk farther each day.  Do not sit, drive or stand in one place for more than 30 minutes.  Do not stay in bed.  Do not avoid exercise or work.  Activity can help your back heal faster.  Be careful when you bend or lift an object.  Bend at your knees, keep the object close to you, and do not twist.  Sleep on a firm mattress.  Lie on your side, and bend your knees.  If you lie on your back, put a pillow under your knees.  Only take medicines as told by your doctor.  Put ice on the injured area.  Put ice in a plastic bag  Place a towel between your skin and the bag  Leave the ice on for 15-20 minutes, 3-4 times a day for the first 2-3 days. 210 After that, you can switch between ice and heat packs.  Ask your doctor about back exercises or massage.  Avoid feeling anxious or stressed.  Find good ways to deal with stress, such as exercise.  Get Help Right Way If:  Your pain does not go away with rest or medicine.  Your pain does not go away in 1 week.  You have new  problems.  You do not feel well.  The pain spreads into your legs.  You cannot control when you poop (bowel movement) or pee (urinate)  You feel sick to your stomach (nauseous) or throw up (vomit)  You have belly (abdominal) pain.  You feel like you may pass out (faint).  If you develop a fever.  Make Sure you:  Understand these instructions.  Will watch your condition  Will get help right away if you are not doing well or get worse.  Your e-visit answers were reviewed by a board certified advanced clinical practitioner to complete your personal care plan.  Depending on the condition, your plan could have included both over the counter or prescription medications.  If there is a problem please reply  once you have received a response from your provider.  Your safety is important to [place].  If you have drug allergies check your prescription carefully.    You can use MyChart to ask questions about today's visit, request a non-urgent call back, or ask for a work or school excuse for 24 hours related to this e-Visit. If it has been greater than 24 hours you will need to follow up with your provider, or enter  a new e-Visit to address those concerns.  You will get an e-mail in the next two days asking about your experience.  I hope that your e-visit has been valuable and will speed your recovery. Thank you for using e-visits.  I spent 5-10 minutes on review and completion of this note- Lacy Duverney [facility]

## 2019-03-03 ENCOUNTER — Telehealth: Payer: Medicare Other | Admitting: Physician Assistant

## 2019-03-03 ENCOUNTER — Encounter: Payer: Self-pay | Admitting: Physician Assistant

## 2019-03-03 DIAGNOSIS — R6883 Chills (without fever): Secondary | ICD-10-CM

## 2019-03-03 DIAGNOSIS — Z20822 Contact with and (suspected) exposure to covid-19: Secondary | ICD-10-CM

## 2019-03-03 DIAGNOSIS — R6889 Other general symptoms and signs: Secondary | ICD-10-CM

## 2019-03-03 DIAGNOSIS — J029 Acute pharyngitis, unspecified: Secondary | ICD-10-CM | POA: Diagnosis not present

## 2019-03-03 MED ORDER — AZITHROMYCIN 250 MG PO TABS: ORAL_TABLET | ORAL | 0 refills | Status: DC

## 2019-03-03 NOTE — Progress Notes (Signed)
E-Visit for Corona Virus Screening   Your current symptoms could be consistent with the coronavirus.  Many health care providers can now test patients at their office but not all are.  [facility] has multiple testing sites. For information on our COVID testing locations and hours go to [email or web address]  Please quarantine yourself while awaiting your test results.  We are enrolling you in our [place] for [facility] . Daily you will receive a questionnaire within the [place] website. Our COVID 19 response team willl be monitoriing your responses daily.    COVID-19 is a respiratory illness with symptoms that are similar to the flu. Symptoms are typically mild to moderate, but there have been cases of severe illness and death due to the virus. The following symptoms may appear 2-14 days after exposure: . Fever . Cough . Shortness of breath or difficulty breathing . Chills . Repeated shaking with chills . Muscle pain . Headache . Sore throat . New loss of taste or smell . Fatigue . Congestion or runny nose . Nausea or vomiting . Diarrhea  It is vitally important that if you feel that you have an infection such as this virus or any other virus that you stay home and away from places where you may spread it to others.  You should self-quarantine for 14 days if you have symptoms that could potentially be coronavirus or have been in close contact a with a person diagnosed with COVID-19 within the last 2 weeks. You should avoid contact with people age 23 and older.   You should wear a mask or cloth face covering over your nose and mouth if you must be around other people or animals, including pets (even at home). Try to stay at least 6 feet away from other people. This will protect the people around you.    You may also take acetaminophen (Tylenol) as needed for fever. I have provided a work note You have been enrolled in [place], which will contact you regarding your symptoms.     Pharyngitis is inflammation in the back of the throat which can cause a sore throat, scratchiness and sometimes difficulty swallowing.   Pharyngitis is typically caused by a respiratory virus and will just run its course.  Please keep in mind that your symptoms could last up to 10 days.  For throat pain, we recommend over the counter oral pain relief medications such as acetaminophen or aspirin, or anti-inflammatory medications such as ibuprofen or naproxen sodium.  Topical treatments such as oral throat lozenges or sprays may be used as needed.  Avoid close contact with loved ones, especially the very young and elderly.  Remember to wash your hands thoroughly throughout the day as this is the number one way to prevent the spread of infection and wipe down door knobs and counters with disinfectant.   Due to persistent and worsening sore throat  I prescribed Azithromycin 250 mg, take 2 pills today, and one pill days 2-5.  Reduce your risk of any infection by using the same precautions used for avoiding the common cold or flu:  Marland Kitchen Wash your hands often with soap and warm water for at least 20 seconds.  If soap and water are not readily available, use an alcohol-based hand sanitizer with at least 60% alcohol.  . If coughing or sneezing, cover your mouth and nose by coughing or sneezing into the elbow areas of your shirt or coat, into a tissue or into  your sleeve (not your hands). . Avoid shaking hands with others and consider head nods or verbal greetings only. . Avoid touching your eyes, nose, or mouth with unwashed hands.  . Avoid close contact with people who are sick. . Avoid places or events with large numbers of people in one location, like concerts or sporting events. . Carefully consider travel plans you have or are making. . If you are planning any travel outside or inside the [place], visit the CDC's Travelers' Health webpage for  the latest health notices. . If you have some symptoms but not all symptoms, continue to monitor at home and seek medical attention if your symptoms worsen. . If you are having a medical emergency, call 911.  HOME CARE . Only take medications as instructed by your medical team. . Drink plenty of fluids and get plenty of rest. . A steam or ultrasonic humidifier can help if you have congestion.   GET HELP RIGHT AWAY IF YOU HAVE EMERGENCY WARNING SIGNS** FOR COVID-19. If you or someone is showing any of these signs seek emergency medical care immediately. Call 911 or proceed to your closest emergency facility if: . You develop worsening high fever. . Trouble breathing . Bluish lips or face . Persistent pain or pressure in the chest . New confusion . Inability to wake or stay awake . You cough up blood. . Your symptoms become more severe  **This list is not all possible symptoms. Contact your medical provider for any symptoms that are sever or concerning to you.   MAKE SURE YOU   Understand these instructions.  Will watch your condition.  Will get help right away if you are not doing well or get worse.  Your e-visit answers were reviewed by a board certified advanced clinical practitioner to complete your personal care plan.  Depending on the condition, your plan could have included both over the counter or prescription medications.  If there is a problem please reply once you have received a response from your provider.  Your safety is important to [place].  If you have drug allergies check your prescription carefully.    You can use MyChart to ask questions about today's visit, request a non-urgent call back, or ask for a work or school excuse for 24 hours related to this e-Visit. If it has been greater than 24 hours you will need to follow up with your provider, or enter a new e-Visit to address those concerns. You will get an e-mail in the next two days asking about your experience.  I  hope that your e-visit has been valuable and will speed your recovery. Thank you for using e-visits.   I spent 5-10 minutes on review and completion of this note- Lacy Duverney [facility]

## 2019-03-05 ENCOUNTER — Encounter (INDEPENDENT_AMBULATORY_CARE_PROVIDER_SITE_OTHER): Payer: Self-pay

## 2019-03-11 ENCOUNTER — Encounter (INDEPENDENT_AMBULATORY_CARE_PROVIDER_SITE_OTHER): Payer: Self-pay

## 2019-04-16 ENCOUNTER — Other Ambulatory Visit: Payer: Self-pay | Admitting: Family Medicine

## 2019-04-16 DIAGNOSIS — F5101 Primary insomnia: Secondary | ICD-10-CM

## 2019-04-16 NOTE — Telephone Encounter (Signed)
Medication Refill - Medication: traZODone (DESYREL) 50 MG tablet  Pt called requesting refill, pt is out and is requesting higher quantity to be able to take up to two at her discretion    Has the patient contacted their pharmacy? Yes.   (Agent: If no, request that the patient contact the pharmacy for the refill.) (Agent: If yes, when and what did the pharmacy advise?)  Preferred Pharmacy (with phone number or street name):  [facility] [place] [address], [address]  [place] [address]  Phone: [phone] Fax: [phone]     Agent: Please be advised that RX refills may take up to 3 business days. We ask that you follow-up with your pharmacy.

## 2019-04-16 NOTE — Telephone Encounter (Signed)
Requested medication (s) are due for refill today   NO  Requested medication (s) are on the active medication list   YES  Future visit scheduled  YES  LOV  01/19/19.  Routing to provider due to patient's request to take medication differently than prescribed.    Requested Prescriptions  Pending Prescriptions Disp Refills   traZODone (DESYREL) 50 MG tablet 90 tablet 3    Sig: TAKE 1/2 TABLET BY MOUTH AT BEDTIME FOR 1 WEEK. INCREASE TO 1 TABLET AFTER THAT     Psychiatry: Antidepressants - Serotonin Modulator Failed - 04/16/2019  3:41 PM      Failed - Completed PHQ-2 or PHQ-9 in the last 360 days.      Passed - Valid encounter within last 6 months    Recent Outpatient Visits          2 months ago Essential hypertension   Primary Care at [facility], Arlie Solomons, MD   3 months ago Essential hypertension   Primary Care at [facility], Arlie Solomons, MD   6 months ago Peripheral edema   Primary Care at [facility], Arlie Solomons, MD   8 months ago Acute recurrent maxillary sinusitis   Primary Care at [facility], Ines Bloomer, MD   12 months ago Allergic rhinitis, unspecified seasonality, unspecified trigger   Primary Care at [facility], Fenton Malling, MD      Future Appointments            In 3 months Forrest Moron, MD Primary Care at [place], [facility]

## 2019-04-17 NOTE — Telephone Encounter (Signed)
Okay to Refill?

## 2019-04-21 NOTE — Telephone Encounter (Signed)
Dr.Stallings pt requesting medication

## 2019-04-21 NOTE — Telephone Encounter (Signed)
Pt checking on status of this message. Pt is out of medication.

## 2019-04-22 MED ORDER — TRAZODONE HCL 50 MG PO TABS: ORAL_TABLET | ORAL | 3 refills | Status: DC

## 2019-04-23 ENCOUNTER — Encounter: Payer: Self-pay | Admitting: Family Medicine

## 2019-04-23 NOTE — Telephone Encounter (Signed)
Patient states the  Trazodone should be 75mg  2 times a day as needed .The script has been sent in wrong 2 times and patient has had to double on this to get the correct dosage. They script that was sent yesterday insurance won't pay because it to early with that dose. Please advise

## 2019-04-23 NOTE — Telephone Encounter (Signed)
Pt stated she is still having issues regarding rx for trazadone and the dosage. Requesting CB from  to discuss. Please advise. [phone]

## 2019-04-24 NOTE — Telephone Encounter (Signed)
Pt request sent to provider to review and represcribe. Dgaddy, CMA

## 2019-04-24 NOTE — Telephone Encounter (Signed)
Pt called back in requesting a call back. Pt says that she is getting upset that this still hasn't been updated and sent in. Pt says initially she was taking 75MG  and then some how it changed to 50.   Please assist pt directly.

## 2019-04-24 NOTE — Telephone Encounter (Signed)
Patient is requesting a call back from [place] regarding this request.

## 2019-04-27 MED ORDER — TRAZODONE HCL 150 MG PO TABS: 75.0000 mg | ORAL_TABLET | Freq: Two times a day (BID) | ORAL | 3 refills | Status: DC

## 2019-04-27 NOTE — Addendum Note (Signed)
Addended by: Delia Chimes A on: 04/27/2019 01:59 PM   Modules accepted: Orders

## 2019-04-28 NOTE — Telephone Encounter (Signed)
Copied from [place] [id number]. Topic: General - Other >> Apr 27, 2019  1:54 PM Carolyn Stare wrote: Pt would like a call back about her traZODone (DESYREL) 50 MG tablet   she is very upset that she has not heard from anyone

## 2019-05-08 ENCOUNTER — Encounter: Payer: Self-pay | Admitting: Podiatry

## 2019-05-08 ENCOUNTER — Other Ambulatory Visit: Payer: Self-pay

## 2019-05-08 ENCOUNTER — Ambulatory Visit (INDEPENDENT_AMBULATORY_CARE_PROVIDER_SITE_OTHER): Payer: Medicare Other | Admitting: Podiatry

## 2019-05-08 DIAGNOSIS — M216X1 Other acquired deformities of right foot: Secondary | ICD-10-CM | POA: Diagnosis not present

## 2019-05-08 DIAGNOSIS — Q828 Other specified congenital malformations of skin: Secondary | ICD-10-CM | POA: Insufficient documentation
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
## 2019-05-09 ENCOUNTER — Other Ambulatory Visit: Payer: Self-pay

## 2019-05-09 ENCOUNTER — Encounter (HOSPITAL_COMMUNITY)
Admission: EM | Disposition: A | Discharge status: Home / Self Care | Payer: Self-pay | Source: Home / Self Care | Attending: Emergency Medicine

## 2019-05-09 ENCOUNTER — Emergency Department (HOSPITAL_COMMUNITY): Payer: Medicare Other | Admitting: Anesthesiology

## 2019-05-09 ENCOUNTER — Encounter (HOSPITAL_COMMUNITY): Payer: Self-pay | Admitting: *Deleted

## 2019-05-09 ENCOUNTER — Emergency Department (HOSPITAL_COMMUNITY): Payer: Medicare Other

## 2019-05-09 ENCOUNTER — Ambulatory Visit (HOSPITAL_COMMUNITY)
Admission: EM | Admit: 2019-05-09 | Discharge: 2019-05-09 | Disposition: A | Discharge status: Home / Self Care | Payer: Medicare Other | Attending: Emergency Medicine | Admitting: Emergency Medicine

## 2019-05-09 DIAGNOSIS — K5900 Constipation, unspecified: Secondary | ICD-10-CM | POA: Diagnosis not present

## 2019-05-09 DIAGNOSIS — Z79899 Other long term (current) drug therapy: Secondary | ICD-10-CM | POA: Insufficient documentation

## 2019-05-09 DIAGNOSIS — Z96653 Presence of artificial knee joint, bilateral: Secondary | ICD-10-CM | POA: Diagnosis not present

## 2019-05-09 DIAGNOSIS — Z880 Allergy status to penicillin: Secondary | ICD-10-CM | POA: Insufficient documentation

## 2019-05-09 DIAGNOSIS — M1711 Unilateral primary osteoarthritis, right knee: Secondary | ICD-10-CM | POA: Diagnosis not present

## 2019-05-09 DIAGNOSIS — Z881 Allergy status to other antibiotic agents status: Secondary | ICD-10-CM | POA: Insufficient documentation

## 2019-05-09 DIAGNOSIS — Z885 Allergy status to narcotic agent status: Secondary | ICD-10-CM | POA: Diagnosis not present

## 2019-05-09 DIAGNOSIS — W1830XA Fall on same level, unspecified, initial encounter: Secondary | ICD-10-CM | POA: Diagnosis not present

## 2019-05-09 DIAGNOSIS — S52571B Other intraarticular fracture of lower end of right radius, initial encounter for open fracture type I or II: Secondary | ICD-10-CM | POA: Diagnosis not present

## 2019-05-09 DIAGNOSIS — S52502B Unspecified fracture of the lower end of left radius, initial encounter for open fracture type I or II: Secondary | ICD-10-CM | POA: Diagnosis present

## 2019-05-09 DIAGNOSIS — S65111A Laceration of radial artery at wrist and hand level of right arm, initial encounter: Secondary | ICD-10-CM | POA: Diagnosis not present

## 2019-05-09 DIAGNOSIS — Z20828 Contact with and (suspected) exposure to other viral communicable diseases: Secondary | ICD-10-CM | POA: Diagnosis not present

## 2019-05-09 DIAGNOSIS — Z7983 Long term (current) use of bisphosphonates: Secondary | ICD-10-CM | POA: Diagnosis not present

## 2019-05-09 DIAGNOSIS — Z888 Allergy status to other drugs, medicaments and biological substances status: Secondary | ICD-10-CM | POA: Diagnosis not present

## 2019-05-09 DIAGNOSIS — I10 Essential (primary) hypertension: Secondary | ICD-10-CM | POA: Diagnosis not present

## 2019-05-09 DIAGNOSIS — Z23 Encounter for immunization: Secondary | ICD-10-CM | POA: Diagnosis not present

## 2019-05-09 DIAGNOSIS — Z886 Allergy status to analgesic agent status: Secondary | ICD-10-CM | POA: Insufficient documentation

## 2019-05-09 DIAGNOSIS — Z01818 Encounter for other preprocedural examination: Secondary | ICD-10-CM

## 2019-05-09 HISTORY — PX: I & D EXTREMITY: SHX5045

## 2019-05-09 HISTORY — PX: OPEN REDUCTION INTERNAL FIXATION (ORIF) DISTAL RADIAL FRACTURE: SHX5989

## 2019-05-09 LAB — CBC WITH DIFFERENTIAL/PLATELET
Abs Immature Granulocytes: 0.03 10*3/uL (ref 0.00–0.07)
Basophils Absolute: 0.1 10*3/uL (ref 0.0–0.1)
Basophils Relative: 1 %
Eosinophils Absolute: 0 10*3/uL (ref 0.0–0.5)
Eosinophils Relative: 0 %
HCT: 42.7 % (ref 36.0–46.0)
Hemoglobin: 14.4 g/dL (ref 12.0–15.0)
Immature Granulocytes: 0 %
Lymphocytes Relative: 11 %
Lymphs Abs: 0.7 10*3/uL (ref 0.7–4.0)
MCH: 31.4 pg (ref 26.0–34.0)
MCHC: 33.7 g/dL (ref 30.0–36.0)
MCV: 93 fL (ref 80.0–100.0)
Monocytes Absolute: 0.4 10*3/uL (ref 0.1–1.0)
Monocytes Relative: 7 %
Neutro Abs: 5.4 10*3/uL (ref 1.7–7.7)
Neutrophils Relative %: 81 %
Platelets: 163 10*3/uL (ref 150–400)
RBC: 4.59 MIL/uL (ref 3.87–5.11)
RDW: 13.8 % (ref 11.5–15.5)
WBC: 6.7 10*3/uL (ref 4.0–10.5)
nRBC: 0 % (ref 0.0–0.2)

## 2019-05-09 LAB — BASIC METABOLIC PANEL
Anion gap: 13 (ref 5–15)
BUN: 14 mg/dL (ref 8–23)
CO2: 25 mmol/L (ref 22–32)
Calcium: 9.3 mg/dL (ref 8.9–10.3)
Chloride: 100 mmol/L (ref 98–111)
Creatinine, Ser: 0.9 mg/dL (ref 0.44–1.00)
GFR calc Af Amer: >60 mL/min (ref >60)
GFR calc non Af Amer: >60 mL/min (ref >60)
Glucose, Bld: 101 mg/dL — ABNORMAL HIGH (ref 70–99)
Potassium: 3.7 mmol/L (ref 3.5–5.1)
Sodium: 138 mmol/L (ref 135–145)

## 2019-05-09 LAB — SARS CORONAVIRUS 2 BY RT PCR (HOSPITAL ORDER, PERFORMED IN ~~LOC~~ HOSPITAL LAB): SARS Coronavirus 2: NEGATIVE

## 2019-05-09 SURGERY — OPEN REDUCTION INTERNAL FIXATION (ORIF) DISTAL RADIUS FRACTURE
Anesthesia: Regional | Site: Wrist | Laterality: Right

## 2019-05-09 MED ORDER — ACETAMINOPHEN 10 MG/ML IV SOLN
INTRAVENOUS | Status: AC
  Filled 2019-05-09: qty 100

## 2019-05-09 MED ORDER — HYDROMORPHONE HCL 1 MG/ML IJ SOLN
0.5000 mg | Freq: Once | INTRAMUSCULAR | Status: AC
  Administered 2019-05-09: 13:00:00 0.5 mg via INTRAVENOUS
  Filled 2019-05-09: qty 1

## 2019-05-09 MED ORDER — BUPIVACAINE-EPINEPHRINE (PF) 0.5% -1:200000 IJ SOLN
INTRAMUSCULAR | Status: DC | PRN
  Administered 2019-05-09: 30 mL via PERINEURAL

## 2019-05-09 MED ORDER — LIDOCAINE 2% (20 MG/ML) 5 ML SYRINGE
INTRAMUSCULAR | Status: AC
  Filled 2019-05-09: qty 5

## 2019-05-09 MED ORDER — ACETAMINOPHEN 10 MG/ML IV SOLN: 1000.0000 mg | Freq: Once | INTRAVENOUS | Status: DC | PRN

## 2019-05-09 MED ORDER — FENTANYL CITRATE (PF) 100 MCG/2ML IJ SOLN: 25.0000 ug | INTRAMUSCULAR | Status: DC | PRN

## 2019-05-09 MED ORDER — EPHEDRINE SULFATE-NACL 50-0.9 MG/10ML-% IV SOSY
PREFILLED_SYRINGE | INTRAVENOUS | Status: DC | PRN
  Administered 2019-05-09 (×4): 10 mg via INTRAVENOUS

## 2019-05-09 MED ORDER — ACETAMINOPHEN 160 MG/5ML PO SOLN: 1000.0000 mg | Freq: Once | ORAL | Status: DC | PRN

## 2019-05-09 MED ORDER — MORPHINE SULFATE (PF) 4 MG/ML IV SOLN
4.0000 mg | Freq: Once | INTRAVENOUS | Status: AC
  Administered 2019-05-09: 12:00:00 4 mg via INTRAVENOUS
  Filled 2019-05-09: qty 1

## 2019-05-09 MED ORDER — LIDOCAINE-EPINEPHRINE 2 %-1:100000 IJ SOLN
INTRAMUSCULAR | Status: DC | PRN
  Administered 2019-05-09: 10 mL via PERINEURAL

## 2019-05-09 MED ORDER — OXYCODONE HCL 5 MG/5ML PO SOLN: 5.0000 mg | Freq: Once | ORAL | Status: DC | PRN

## 2019-05-09 MED ORDER — TETANUS-DIPHTH-ACELL PERTUSSIS 5-2.5-18.5 LF-MCG/0.5 IM SUSP
0.5000 mL | Freq: Once | INTRAMUSCULAR | Status: AC
  Administered 2019-05-09: 12:00:00 0.5 mL via INTRAMUSCULAR
  Filled 2019-05-09: qty 0.5

## 2019-05-09 MED ORDER — OXYCODONE HCL 5 MG PO TABS: 5.0000 mg | ORAL_TABLET | Freq: Once | ORAL | Status: DC | PRN

## 2019-05-09 MED ORDER — ACETAMINOPHEN 500 MG PO TABS: 1000.0000 mg | ORAL_TABLET | Freq: Once | ORAL | Status: DC | PRN

## 2019-05-09 MED ORDER — HYDROCODONE-ACETAMINOPHEN 5-325 MG PO TABS: ORAL_TABLET | ORAL | 0 refills | Status: AC

## 2019-05-09 MED ORDER — DEXAMETHASONE SODIUM PHOSPHATE 10 MG/ML IJ SOLN
INTRAMUSCULAR | Status: DC | PRN
  Administered 2019-05-09: 4 mg via INTRAVENOUS

## 2019-05-09 MED ORDER — STERILE WATER FOR IRRIGATION IR SOLN
Status: DC | PRN
  Administered 2019-05-09: 1000 mL

## 2019-05-09 MED ORDER — SODIUM CHLORIDE 0.9 % IV SOLN
INTRAVENOUS | Status: DC
  Administered 2019-05-09: 12:00:00 via INTRAVENOUS

## 2019-05-09 MED ORDER — ONDANSETRON HCL 4 MG/2ML IJ SOLN
INTRAMUSCULAR | Status: AC
  Filled 2019-05-09: qty 2

## 2019-05-09 MED ORDER — PROPOFOL 10 MG/ML IV BOLUS
INTRAVENOUS | Status: AC
  Filled 2019-05-09: qty 20

## 2019-05-09 MED ORDER — PHENYLEPHRINE 40 MCG/ML (10ML) SYRINGE FOR IV PUSH (FOR BLOOD PRESSURE SUPPORT)
PREFILLED_SYRINGE | INTRAVENOUS | Status: DC | PRN
  Administered 2019-05-09: 40 ug via INTRAVENOUS

## 2019-05-09 MED ORDER — FENTANYL CITRATE (PF) 250 MCG/5ML IJ SOLN
INTRAMUSCULAR | Status: AC
  Filled 2019-05-09: qty 5

## 2019-05-09 MED ORDER — 0.9 % SODIUM CHLORIDE (POUR BTL) OPTIME
TOPICAL | Status: DC | PRN
  Administered 2019-05-09: 15:00:00 1000 mL

## 2019-05-09 MED ORDER — CEFAZOLIN SODIUM-DEXTROSE 2-4 GM/100ML-% IV SOLN
2.0000 g | Freq: Once | INTRAVENOUS | Status: AC
  Administered 2019-05-09: 2 g via INTRAVENOUS
  Filled 2019-05-09: qty 100

## 2019-05-09 MED ORDER — ONDANSETRON HCL 4 MG/2ML IJ SOLN
INTRAMUSCULAR | Status: DC | PRN
  Administered 2019-05-09: 4 mg via INTRAVENOUS

## 2019-05-09 MED ORDER — DOXYCYCLINE HYCLATE 50 MG PO CAPS: 100.0000 mg | ORAL_CAPSULE | Freq: Two times a day (BID) | ORAL | 0 refills | Status: DC

## 2019-05-09 MED ORDER — GENTAMICIN SULFATE 40 MG/ML IJ SOLN: 5.0000 mg/kg | Freq: Once | INTRAVENOUS | Status: DC

## 2019-05-09 MED ORDER — ACETAMINOPHEN 10 MG/ML IV SOLN
INTRAVENOUS | Status: DC | PRN
  Administered 2019-05-09: 1000 mg via INTRAVENOUS

## 2019-05-09 MED ORDER — CEFAZOLIN SODIUM-DEXTROSE 2-3 GM-%(50ML) IV SOLR
INTRAVENOUS | Status: DC | PRN
  Administered 2019-05-09: 2 g via INTRAVENOUS

## 2019-05-09 MED ORDER — PROPOFOL 10 MG/ML IV BOLUS
INTRAVENOUS | Status: DC | PRN
  Administered 2019-05-09: 100 mg via INTRAVENOUS
  Administered 2019-05-09: 20 mg via INTRAVENOUS

## 2019-05-09 MED ORDER — EPHEDRINE 5 MG/ML INJ
INTRAVENOUS | Status: AC
  Filled 2019-05-09: qty 10

## 2019-05-09 MED ORDER — CEFAZOLIN SODIUM-DEXTROSE 1-4 GM/50ML-% IV SOLN
INTRAVENOUS | Status: DC | PRN
  Administered 2019-05-09: 1 g via INTRAVENOUS

## 2019-05-09 MED ORDER — SODIUM CHLORIDE 0.9 % IR SOLN
Status: DC | PRN
  Administered 2019-05-09: 3000 mL

## 2019-05-09 MED ORDER — ONDANSETRON HCL 4 MG PO TABS: 4.0000 mg | ORAL_TABLET | Freq: Three times a day (TID) | ORAL | 0 refills | Status: DC | PRN

## 2019-05-09 MED ORDER — CEFAZOLIN SODIUM 1 G IJ SOLR
INTRAMUSCULAR | Status: AC
  Filled 2019-05-09: qty 30

## 2019-05-09 MED ORDER — BUPIVACAINE HCL (PF) 0.25 % IJ SOLN
INTRAMUSCULAR | Status: AC
  Filled 2019-05-09: qty 30

## 2019-05-09 MED ORDER — LACTATED RINGERS IV SOLN: INTRAVENOUS | Status: DC

## 2019-05-09 MED ORDER — LIDOCAINE 2% (20 MG/ML) 5 ML SYRINGE
INTRAMUSCULAR | Status: DC | PRN
  Administered 2019-05-09: 60 mg via INTRAVENOUS

## 2019-05-09 MED ORDER — ONDANSETRON HCL 4 MG/2ML IJ SOLN
4.0000 mg | Freq: Once | INTRAMUSCULAR | Status: AC
  Administered 2019-05-09: 12:00:00 4 mg via INTRAVENOUS
  Filled 2019-05-09: qty 2

## 2019-05-09 MED ORDER — FENTANYL CITRATE (PF) 250 MCG/5ML IJ SOLN
INTRAMUSCULAR | Status: DC | PRN
  Administered 2019-05-09 (×2): 50 ug via INTRAVENOUS

## 2019-05-09 SURGICAL SUPPLY — 92 items
BIT DRILL 2.0 LNG QUCK RELEASE (BIT) IMPLANT
BIT DRILL 2.8 QUICK RELEASE (BIT) IMPLANT
BLADE CLIPPER SURG (BLADE) IMPLANT
BNDG CMPR 9X4 STRL LF SNTH (GAUZE/BANDAGES/DRESSINGS) ×1
BNDG COHESIVE 2X5 TAN STRL LF (GAUZE/BANDAGES/DRESSINGS) IMPLANT
BNDG ELASTIC 3X5.8 VLCR STR LF (GAUZE/BANDAGES/DRESSINGS) ×2 IMPLANT
BNDG ELASTIC 4X5.8 VLCR STR LF (GAUZE/BANDAGES/DRESSINGS) ×2 IMPLANT
BNDG ESMARK 4X9 LF (GAUZE/BANDAGES/DRESSINGS) ×2 IMPLANT
BNDG GAUZE ELAST 4 BULKY (GAUZE/BANDAGES/DRESSINGS) ×2 IMPLANT
CORD BIPOLAR FORCEPS 12FT (ELECTRODE) ×2 IMPLANT
COVER SURGICAL LIGHT HANDLE (MISCELLANEOUS) ×2 IMPLANT
COVER WAND RF STERILE (DRAPES) ×2 IMPLANT
CUFF TOURN SGL QUICK 18X4 (TOURNIQUET CUFF) ×2 IMPLANT
CUFF TOURN SGL QUICK 24 (TOURNIQUET CUFF)
CUFF TRNQT CYL 24X4X16.5-23 (TOURNIQUET CUFF) IMPLANT
DECANTER SPIKE VIAL GLASS SM (MISCELLANEOUS) ×1 IMPLANT
DRAIN PENROSE 1/4X12 LTX STRL (WOUND CARE) IMPLANT
DRAIN TLS ROUND 10FR (DRAIN) IMPLANT
DRAPE OEC MINIVIEW 54X84 (DRAPES) ×1 IMPLANT
DRAPE SURG 17X23 STRL (DRAPES) ×2 IMPLANT
DRILL 2.0 LNG QUICK RELEASE (BIT) ×2
DRILL 2.8 QUICK RELEASE (BIT) ×2
DRSG PAD ABDOMINAL 8X10 ST (GAUZE/BANDAGES/DRESSINGS) IMPLANT
GAUZE SPONGE 4X4 12PLY STRL LF (GAUZE/BANDAGES/DRESSINGS) ×1 IMPLANT
GAUZE SPONGE 4X4 16PLY XRAY LF (GAUZE/BANDAGES/DRESSINGS) ×1 IMPLANT
GAUZE XEROFORM 1X8 LF (GAUZE/BANDAGES/DRESSINGS) ×1 IMPLANT
GAUZE XEROFORM 5X9 LF (GAUZE/BANDAGES/DRESSINGS) ×1 IMPLANT
GLOVE BIO SURGEON STRL SZ7.5 (GLOVE) ×2 IMPLANT
GLOVE BIOGEL PI IND STRL 7.5 (GLOVE) IMPLANT
GLOVE BIOGEL PI IND STRL 8 (GLOVE) ×1 IMPLANT
GLOVE BIOGEL PI INDICATOR 7.5 (GLOVE) ×2
GLOVE BIOGEL PI INDICATOR 8 (GLOVE) ×2
GLOVE SURG SYN 7.5  E (GLOVE) ×3
GLOVE SURG SYN 7.5 E (GLOVE) ×3 IMPLANT
GLOVE SURG SYN 7.5 PF PI (GLOVE) IMPLANT
GOWN STRL REIN XL XLG (GOWN DISPOSABLE) ×2 IMPLANT
GOWN STRL REUS W/ TWL LRG LVL3 (GOWN DISPOSABLE) ×3 IMPLANT
GOWN STRL REUS W/ TWL XL LVL3 (GOWN DISPOSABLE) ×3 IMPLANT
GOWN STRL REUS W/TWL LRG LVL3 (GOWN DISPOSABLE)
GOWN STRL REUS W/TWL XL LVL3 (GOWN DISPOSABLE) ×6
GUIDEWIRE ORTHO 0.054X6 (WIRE) ×2 IMPLANT
KIT BASIN OR (CUSTOM PROCEDURE TRAY) ×2 IMPLANT
KIT TURNOVER KIT B (KITS) ×2 IMPLANT
LOOP VESSEL MAXI BLUE (MISCELLANEOUS) IMPLANT
MANIFOLD NEPTUNE II (INSTRUMENTS) ×2 IMPLANT
NDL HYPO 25X1 1.5 SAFETY (NEEDLE) IMPLANT
NEEDLE 22X1 1/2 (OR ONLY) (NEEDLE) IMPLANT
NEEDLE HYPO 25X1 1.5 SAFETY (NEEDLE) IMPLANT
NS IRRIG 1000ML POUR BTL (IV SOLUTION) ×2 IMPLANT
PACK ORTHO EXTREMITY (CUSTOM PROCEDURE TRAY) ×2 IMPLANT
PAD ARMBOARD 7.5X6 YLW CONV (MISCELLANEOUS) ×4 IMPLANT
PAD CAST 3X4 CTTN HI CHSV (CAST SUPPLIES) IMPLANT
PAD CAST 4YDX4 CTTN HI CHSV (CAST SUPPLIES) ×1 IMPLANT
PADDING CAST COTTON 3X4 STRL (CAST SUPPLIES) ×2
PADDING CAST COTTON 4X4 STRL (CAST SUPPLIES) ×2
PLATE R LONG NARROW PROX (Plate) ×1 IMPLANT
SCREW BN FT 16X2.3XLCK HEX CRT (Screw) IMPLANT
SCREW CORTICAL LOCKING 2.3X14M (Screw) ×1 IMPLANT
SCREW CORTICAL LOCKING 2.3X16M (Screw) ×12 IMPLANT
SCREW FX16X2.3XLCK SMTH NS CRT (Screw) IMPLANT
SCREW HEXALOBE NON-LOCK 3.5X14 (Screw) ×1 IMPLANT
SCREW HEXALOBE NON-LOCK 3.5X16 (Screw) ×1 IMPLANT
SCREW NLCKG 13 3.5X13 HEXA (Screw) IMPLANT
SCREW NON-LOCK 3.5X13 (Screw) ×2 IMPLANT
SCREW NONLOCK HEX 3.5X12 (Screw) ×3 IMPLANT
SET CYSTO W/LG BORE CLAMP LF (SET/KITS/TRAYS/PACK) ×1 IMPLANT
SLING ARM FOAM STRAP MED (SOFTGOODS) ×1 IMPLANT
SOL PREP POV-IOD 4OZ 10% (MISCELLANEOUS) ×4 IMPLANT
SPLINT PLASTER EXTRA FAST 3X15 (CAST SUPPLIES) ×1
SPLINT PLASTER GYPS XFAST 3X15 (CAST SUPPLIES) IMPLANT
SPONGE LAP 4X18 RFD (DISPOSABLE) ×1 IMPLANT
SUCTION FRAZIER TIP 10 FR DISP (SUCTIONS) ×1 IMPLANT
SUT ETHIBOND 3-0 V-5 (SUTURE) ×1 IMPLANT
SUT ETHILON 4 0 P 3 18 (SUTURE) IMPLANT
SUT ETHILON 4 0 PS 2 18 (SUTURE) ×4 IMPLANT
SUT ETHILON 8 0 BV130 4 (SUTURE) ×1 IMPLANT
SUT MNCRL AB 4-0 PS2 18 (SUTURE) ×1 IMPLANT
SUT MON AB 5-0 P3 18 (SUTURE) IMPLANT
SUT PROLENE 3 0 PS 2 (SUTURE) IMPLANT
SUT VIC AB 3-0 FS2 27 (SUTURE) ×1 IMPLANT
SWAB COLLECTION DEVICE MRSA (MISCELLANEOUS) IMPLANT
SWAB CULTURE ESWAB REG 1ML (MISCELLANEOUS) IMPLANT
SYR CONTROL 10ML LL (SYRINGE) IMPLANT
SYSTEM CHEST DRAIN TLS 7FR (DRAIN) IMPLANT
TOWEL GREEN STERILE (TOWEL DISPOSABLE) ×2 IMPLANT
TOWEL GREEN STERILE FF (TOWEL DISPOSABLE) ×2 IMPLANT
TUBE CONNECTING 12X1/4 (SUCTIONS) ×2 IMPLANT
TUBE EVACUATION TLS (MISCELLANEOUS) ×2 IMPLANT
TUBE FEEDING ENTERAL 5FR 16IN (TUBING) IMPLANT
UNDERPAD 30X30 (UNDERPADS AND DIAPERS) ×3 IMPLANT
WATER STERILE IRR 1000ML POUR (IV SOLUTION) ×2 IMPLANT
YANKAUER SUCT BULB TIP NO VENT (SUCTIONS) ×2 IMPLANT

## 2019-05-09 NOTE — Op Note (Addendum)
NAME: Kelli Swanson RECORD NO: [id number] DATE OF BIRTH: [birth year] FACILITY: [facility] LOCATION: [facility] OR PHYSICIAN: Tennis Must, MD   OPERATIVE REPORT   DATE OF PROCEDURE: 05/09/19    PREOPERATIVE DIAGNOSIS:   Right open comminuted intra-articular distal radius fracture with retained hardware and possible radial artery injury   POSTOPERATIVE DIAGNOSIS:   1.  Right grade 1 open comminuted intra-articular distal radius fracture 3 intra-articular fragments 2.  Retained hardware right radius 3.  Right radial artery laceration   PROCEDURE:   1.  Irrigation and debridement of grade 1 open right comminuted intra-articular distal radius fracture 2.  Open reduction internal fixation right open comminuted intra-articular distal radius fracture with 3 intra-articular fragments 3.  Removal of deep hardware right radius 4.  Repair right radial artery 5.  Right brachioradialis tendon release   SURGEON:  Leanora Cover, M.D.   ASSISTANT: none   ANESTHESIA:  General with regional   INTRAVENOUS FLUIDS:  Per anesthesia flow sheet.   ESTIMATED BLOOD LOSS:  Minimal.   COMPLICATIONS:  None.   SPECIMENS:  none   TOURNIQUET TIME:    Total Tourniquet Time Documented: Upper Arm (Right) - 127 minutes Upper Arm (Right) - 89 minutes Total: Upper Arm (Right) - 216 minutes    DISPOSITION:  Stable to PACU.   INDICATIONS: 73 year old female states she slipped on her ramp at her home this morning onto her right wrist.  She was seen in the emergency department where she was found to have a open wound volarly and radiographs showed a comminuted intra-articular distal radius fracture.  We discussed treatment options.  She wished to proceed with operative irrigation and debridement open reduction internal fixation of the fracture and removal of hardware as necessary. Risks, benefits and alternatives of surgery were discussed including the risks of blood loss, infection, damage to nerves,  vessels, tendons, ligaments, bone for surgery, need for additional surgery, complications with wound healing, continued pain, nonunion, malunion, stiffness.  She voiced understanding of these risks and elected to proceed.  OPERATIVE COURSE:  After being identified preoperatively by myself,  the patient and I agreed on the procedure and site of the procedure.  The surgical site was marked.  Surgical consent had been signed. She was given IV antibiotics as preoperative antibiotic prophylaxis. She was transferred to the operating room and placed on the operating table in supine position with the Right Right upper extremity on an arm board.  General anesthesia was induced by the anesthesiologist. A regional block had been performed by anesthesia in preoperative holding.   Right upper extremity was prepped and draped in normal sterile orthopedic fashion.  A surgical pause was performed between the surgeons, anesthesia, and operating room staff and all were in agreement as to the patient, procedure, and site of procedure.  Tourniquet at the proximal aspect of the extremity was inflated to 250 mmHg after exsanguination of the arm with an Esmarch bandage.    Incision was made extending the open wound volarly.  This included previous scar.  This is carried in subcutaneous tissues by spreading technique.  The palmaris longus tendon was easily identified.  The FCR tendon was not easily found.  The proximal portion of the fracture was in the subcutaneous tissues.  There was no gross contamination.  The FCR tendon had displaced posterior to the proximal radius fragment.  This was able to be reduced back volarly.  A knife was used to sharply remove the skin and subcutaneous tissues at  the open fracture site.  It was also used to remove tissue deep to fascia including partial laceration of the FCR tendon and periosteum at the site of bone that had been exposed.  The wound was explored.  There was hematoma within the wound.   Superficial branch of the radial nerve was identified and was intact.  There was a laceration of the radial artery.  The wound and fracture site were copiously irrigated with sterile saline by cystoscopy tubing.  The brachioradialis tendon was identified and released to aid in reduction.  As the fracture had a large dorsal fragment an incision was made on the dorsum of the wrist with plans for a dorsal plate.  This was carried into subcutaneous tissues by spreading technique.  Bipolar electrocautery was used to obtain hemostasis.  The EPL tendon was released and transposed.  The interval between the third and fourth dorsal compartments was made in the periosteum elevated.  There was a large dorsal cortical fragment.  There was also fracturing distally at the dorsal rim which was also intra-articular.  A dorsal plate was placed.  This had poor fit.  It did not seem as though it would be adequate to stabilize the distal fragments as they displaced volarly.  Decision was made to remove the radial plate and use a volar distal radius plate.  The incision volarly was extended proximally to allow access to the previous plate.  Soft soft tissues were elevated off from the plate.  There was some bone growth onto the top of the plate.  This was removed with an osteotome.  All 8 screws were then removed.  No screws broke.  The osteotome was used to free up the edges of the plate where the bone had started to go up on the edge.  Bone had grown into the screw holes as well.  The plate was removed.  Rongeurs were used to take down the portion of bone that had grown up into the plate and the rim of bone that had grown onto the edge of the plate.  At this point the tourniquet had reached 127 minutes.  Moistened sponges were placed into the wounds with dry sponges over top and a Ace bandage.  The tourniquet was deflated.  It was deflated for greater than 20 minutes allowing perfusion of the hand.  There was good perfusion in the  hand with brisk capillary refill.  After greater than 20 minutes had passed the arm was exsanguinated again and the tourniquet reinflated to 250 mmHg.  The Acumed volar distal radial plates were used.  A long plate was used to allow spanning past the large dorsal cortical fragment.  C-arm was used in AP and lateral projections to aid in reduction position of the hardware.  The plate was secured to the bone with guidepins.  The dorsal cortical fragment was then held in place with a clamp.  The shaft holes were then filled first with nonlocking screws.  Standard AO drilling and measuring technique was used.  Good purchase was obtained.  2 holes were left open for later filling.  There was good lagging of the dorsal cortical fragment into place.  The articular fragments were then reduced into place.  The 2 screws from the Sauv-Kapandji procedure did not have to be removed.  These were more toward the dorsum of the radius.  The distal holes in the plate were filled with locking pegs with the exception of the styloid holes which were filled with  locking screws.  Good reduction was obtained.  The remaining 2 holes in the shaft of the plate were filled with nonlocking screws.  One had weaker purchase but the remaining screws all had good purchase.  The C-arm was used in AP lateral and oblique projections to ensure appropriate reduction and position of heart which was the case.  The wounds were again copiously irrigated with sterile saline.  The dorsal wound was closed.  3-0 Vicryl suture was used to repair periosteum back over top of the bone as best possible.  2 inverted interrupted Vicryl sutures placed in subcutaneous tissues and the skin was closed with 4-0 nylon in a horizontal mattress fashion.  The radial artery laceration in the volar wound was then addressed.  The ends were freshened with the straight scissors.  The lumen was cleared of clot.  An 8-0 nylon suture was used in an interrupted circumferential fashion  to reapproximate the arterial ends.  Good tension-free reapproximation was obtained.  The wound was again copiously irrigated with sterile saline.  Inverted interrupted Vicryl sutures were placed in subcutaneous tissues and the skin was closed with 4-0 nylon in a horizontal mattress fashion except over the arterial repair site.  The tourniquet was deflated at 89 minutes.  There was pulsing in the radial artery.  There was no leaking from the anastomosis site. Fingertips and palm were pink with brisk capillary refill.    The remaining portion of the wound was closed with 4-0 nylon in a horizontal mattress fashion.  The wounds were dressed with sterile Xeroform and 4 x 4's and wrapped with a Kerlix bandage.  A volar splint was placed and wrapped with Kerlix and Ace bandage.  The operative  drapes were broken down.  The patient was awoken from anesthesia safely.  She was transferred back to the stretcher and taken to PACU in stable condition.  I will see her back in the office in 1 week for postoperative followup.  I will give her a prescription for Norco 5/325 1-2 tabs PO q6 hours prn pain, dispense # 30 and Doxycycline 100 mg p.o. twice daily x10 days.   Leanora Cover, MD Electronically signed, 05/09/19

## 2019-05-09 NOTE — Anesthesia Procedure Notes (Signed)
Procedure Name: LMA Insertion Date/Time: 05/09/2019 2:43 PM Performed by: Renato Shin, CRNA Pre-anesthesia Checklist: Patient identified, Emergency Drugs available, Suction available and Patient being monitored Patient Re-evaluated:Patient Re-evaluated prior to induction Oxygen Delivery Method: Circle system utilized Preoxygenation: Pre-oxygenation with 100% oxygen Induction Type: IV induction LMA: LMA inserted LMA Size: 4.0 Number of attempts: 1 Placement Confirmation: positive ETCO2 and breath sounds checked- equal and bilateral Tube secured with: Tape Dental Injury: Teeth and Oropharynx as per pre-operative assessment

## 2019-05-09 NOTE — Op Note (Signed)
Intra-operative fluoroscopic images in the AP, lateral, and oblique views were taken and evaluated by myself.  Reduction and hardware placement were confirmed.  There was no intraarticular penetration of permanent hardware.  

## 2019-05-09 NOTE — ED Triage Notes (Signed)
Patient states she was walking down a ramp at her house and slipped trying to catch herself she fell onto her right wrist, obv. deformitiy with active bleeding.

## 2019-05-09 NOTE — ED Notes (Signed)
Helped get patient undress on the monitor did ekg shown to Dr Wilson Singer patient is resting with call bell in reach and family at bedside

## 2019-05-09 NOTE — Anesthesia Procedure Notes (Addendum)
Anesthesia Regional Block: Axillary brachial plexus block   Pre-Anesthetic Checklist: ,, timeout performed, Correct Patient, Correct Site, Correct Laterality, Correct Procedure, Correct Position, site marked, Risks and benefits discussed,  Surgical consent,  Pre-op evaluation,  At surgeon's request and post-op pain management  Laterality: Right and Upper  Prep: chloraprep       Needles:  Injection technique: Single-shot     Needle Length: 9cm  Needle Gauge: 22     Additional Needles: Arrow StimuQuik ECHO Echogenic Stimulating PNB Needle  Procedures:,,,, ultrasound used (permanent image in chart),,,,  Narrative:  Start time: 05/09/2019 2:40 PM End time: 05/09/2019 2:44 PM Injection made incrementally with aspirations every 5 mL.  Performed by: Personally  Anesthesiologist: Oleta Mouse, MD

## 2019-05-09 NOTE — Discharge Instructions (Addendum)
Cast or Splint Care, Adult Casts and splints are supports that are worn to protect broken bones and other injuries. A cast or splint may hold a bone still and in the correct position while it heals. Casts and splints may also help to ease pain, swelling, and muscle spasms. How to care for your cast   Do not stick anything inside the cast to scratch your skin.  Check the skin around the cast every day. Tell your doctor about any concerns.  You may put lotion on dry skin around the edges of the cast. Do not put lotion on the skin under the cast.  Keep the cast clean.  If the cast is not waterproof: ? Do not let it get wet. ? Cover it with a watertight covering when you take a bath or a shower. How to care for your splint   Wear it as told by your doctor. Take it off only as told by your doctor.  Loosen the splint if your fingers or toes tingle, get numb, or turn cold and blue.  Keep the splint clean.  If the splint is not waterproof: ? Do not let it get wet. ? Cover it with a watertight covering when you take a bath or a shower. Follow these instructions at home: Bathing  Do not take baths or swim until your doctor says it is okay. Ask your doctor if you can take showers. You may only be allowed to take sponge baths for bathing.  If your cast or splint is not waterproof, cover it with a watertight covering when you take a bath or shower. Managing pain, stiffness, and swelling  Move your fingers or toes often to avoid stiffness and to lessen swelling.  Raise (elevate) the injured area above the level of your heart while sitting or lying down. Safety  Do not use the injured limb to support your body weight until your doctor says that it is okay.  Use crutches or other assistive devices as told by your doctor. General instructions  Do not put pressure on any part of the cast or splint until it is fully hardened. This may take many hours.  Return to your normal  activities as told by your doctor. Ask your doctor what activities are safe for you.  Keep all follow-up visits as told by your doctor. This is important. Contact a doctor if:  Your cast or splint gets damaged.  The skin around the cast gets red or raw.  The skin under the cast is very itchy or painful.  Your cast or splint feels very uncomfortable.  Your cast or splint is too tight or too loose.  Your cast becomes wet or it starts to have a soft spot or area.  You get an object stuck under your cast. Get help right away if:  Your pain gets worse.  The injured area tingles, gets numb, or turns blue and cold.  The part of your body above or below the cast is swollen and it turns a different color (is discolored).  You cannot feel or move your fingers or toes.  There is fluid leaking through the cast.  You have very bad pain or pressure under the cast.  You have trouble breathing.  You have shortness of breath.  You have chest pain. This information is not intended to replace advice given to you by your health care provider. Make sure you discuss any questions you have with your health care provider. Document  Released: 11/29/2010 Document Revised: 11/19/2018 Document Reviewed: 07/20/2016 Elsevier Patient Education  2020 [place].   Forearm Fracture Rehab Ask your health care provider which exercises are safe for you. Do exercises exactly as told by your health care provider and adjust them as directed. It is normal to feel mild stretching, pulling, tightness, or discomfort as you do these exercises. Stop right away if you feel sudden pain or your pain gets worse. Do not begin these exercises until told by your health care provider. Stretching and range-of-motion exercises These exercises warm up your muscles and joints and improve the movement and flexibility of your wrist and forearm. The exercises also help to relieve pain and stiffness. Wrist flexion 1. Stand or  sit with your left / right elbow bent to a 90-degree angle (right angle) at your side and your palm facing the floor. 2. Slowly bend your wrist so that your fingers move toward the floor (flexion), stopping when you feel a gentle stretch over the back of your forearm. 3. Hold this position for _________ seconds. 4. Return to the starting position. Repeat __________ times. Complete this exercise __________ times a day. Wrist extension 1. Stand or sit with your left / right elbow bent to a 90-degree angle (right angle) at your side and your palm facing the floor. 2. Slowly lift your wrist up so that your fingers move toward the ceiling (extension), stopping when you feel a gentle stretch over the bottom of your forearm. 3. Hold this position for _________ seconds. 4. Return to the starting position. Repeat __________ times. Complete this exercise __________ times a day. Forearm rotation, supination 1. Stand or sit with your left / right elbow bent to a 90-degree angle (right angle) at your side. Position your forearm so that the thumb is facing the ceiling (neutral position). 2. Turn (rotate) your palm up to the ceiling (supination), stopping when you feel a gentle stretch. 3. Hold this position for _________ seconds. 4. Return to the starting position. Repeat __________ times. Complete this exercise __________ times a day. Forearm rotation, pronation 1. Stand or sit with your left / right elbow bent to a 90-degree angle (right angle) at your side. Position your forearm so that the thumb is facing the ceiling (neutral position). 2. Rotate your palm down toward the floor (pronation), stopping when you feel a gentle stretch. 3. Hold this position for _________ seconds. 4. Return to the starting position. Repeat __________ times. Complete this exercise __________ times a day. Wrist flexion, assisted  1. Extend your left / right arm in front of you, and turn your palm down toward the floor. ? If  told by your health care provider, bend your left / right elbow to a 90-degree angle (right angle) at your side. 2. Using your uninjured hand, gently press over the back of your left / right hand to bend your wrist and fingers toward the floor (assisted flexion). Go as far as you can to feel a stretch without causing pain. 3. Hold this position for __________ seconds. 4. Return to the starting position. Repeat __________ times. Complete this exercise __________ times a day. Wrist extension, assisted  1. Extend your left / right arm in front of you and turn your palm up toward the ceiling. ? If told by your health care provider, bend your left / right elbow to a 90-degree angle (right angle) at your side. 2. Using your uninjured hand, gently pull your palm and fingers of your left / right  hand to bend your wrist and fingers toward the floor (assisted extension). Go as far as you can to feel a stretch without causing pain. 3. Hold this position for __________ seconds. 4. Return to the starting position. Repeat __________ times. Complete this exercise __________ times a day. Assisted forearm rotation, supination 1. Stand or sit with your elbows at your sides. 2. Bend your left / right elbow to a 90-degree angle (right angle). 3. Using your uninjured hand, turn your left / right palm up toward the ceiling (assisted supination) until you feel a gentle stretch in the inside of your forearm. 4. Hold this position for __________ seconds. 5. Return to the starting position. Repeat __________ times. Complete this exercise __________ times a day. Assisted forearm rotation, pronation 1. Stand or sit with your arms at your sides. 2. Bend your left / right elbow to a 90-degree angle (right angle). 3. Using your uninjured hand, turn your palm down toward the floor (assisted pronation) until you feel a gentle stretch in the top of your forearm. 4. Hold this position for __________ seconds. 5. Return to the  starting position. Repeat __________ times. Complete this exercise __________ times a day. Strengthening exercises These exercises build strength and endurance in your forearm. Endurance is the ability to use your muscles for a long time, even after they get tired. Grip strengthening  1. Hold one of these items in your left / right hand: a dense sponge, a stress ball, or a large, rolled sock. 2. Slowly squeeze the object as hard as you can without increasing any pain. 3. Hold your squeeze for __________ seconds. Slowly release your grip. Repeat __________ times. Complete this exercise __________ times a day. Wrist flexion 1. Sit with your left / right forearm supported on a table. Your elbow should be at waist height. 2. Rest your hand over the edge of the table, palm up. 3. Gently grasp a __________ lb (kg) weight or a similar object. As this exercise gets easier for you, you may be asked to use more and more weight. 4. Without moving your forearm or elbow, slowly bend your wrist up toward the ceiling (wrist flexion). 5. Hold this position for __________ seconds. 6. Slowly return to the starting position. Repeat __________ times. Complete this exercise __________ times a day. Wrist extension 1. Sit with your left / right forearm supported on a table. Your elbow should be at waist height. 2. Rest your hand over the edge of the table, palm down. 3. Gently grasp a __________ lb (kg) weight or a similar object. As this exercise gets easier for you, you may be asked to use more and more weight. 4. Without moving your forearm or elbow, slowly bend your wrist up toward the ceiling (extension). 5. Hold this position for __________ seconds. 6. Slowly return to the starting position. Repeat __________ times. Complete this exercise __________ times a day. Forearm rotation, supination  1. Sit with your left / right forearm supported on a table. Your elbow should be at waist height. 2. Rest your  hand over the edge of the table, palm down. 3. Gently grasp a lightweight hammer near the head. As this exercise gets easier for you, try holding the hammer farther down the handle. 4. Without moving your elbow, slowly rotate your palm up toward the ceiling (supination). 5. Hold this position for __________ seconds. 6. Slowly return to the starting position. Repeat __________ times. Complete this exercise __________ times a day. Forearm rotation, pronation  1. Sit with your left / right forearm supported on a table. Your elbow should be at waist height. 2. Rest your hand over the edge of the table, palm up. 3. Gently grasp a lightweight hammer near the head. As this exercise gets easier for you, try holding the hammer farther down the handle. 4. Without moving your elbow, slowly rotate your palm down toward the floor (pronation). 5. Hold this position for __________ seconds. 6. Slowly return to the starting position. Repeat __________ times. Complete this exercise __________ times a day. This information is not intended to replace advice given to you by your health care provider. Make sure you discuss any questions you have with your health care provider. Document Released: 07/30/2005 Document Revised: 11/20/2018 Document Reviewed: 09/04/2018 Elsevier Patient Education  2020 [place] or Splint Care, Adult Casts and splints are supports that are worn to protect broken bones and other injuries. A cast or splint may hold a bone still and in the correct position while it heals. Casts and splints may also help ease pain, swelling, and muscle spasms. A cast is a hardened support that is usually made of fiberglass or plaster. It is custom-fit to the body and it offers more protection than a splint. It cannot be taken off and put back on. A splint is a type of soft support that is usually made from cloth and elastic. It can be adjusted or taken off as needed. You may need a cast or a  splint if you:  Have a broken bone.  Have a soft-tissue injury.  Need to keep an injured body part from moving (keep it immobile) after surgery. How is this treated? If you have a cast:   Do not stick anything inside the cast to scratch your skin. Sticking something in the cast increases your risk of infection.  Check the skin around the cast every day. Tell your health care provider about any concerns.  You may put lotion on dry skin around the edges of the cast. Do not put lotion on the skin underneath the cast.  Keep the cast clean.  If the cast is not waterproof: ? Do not let it get wet. ? Cover it with a watertight covering when you take a bath or a shower. If you have a splint:   Wear it as told by your health care provider. Remove it only as told by your health care provider.  Loosen the splint if your fingers or toes tingle, become numb, or turn cold and blue.  Keep the splint clean.  If the splint is not waterproof: ? Do not let it get wet. ? Cover it with a watertight covering when you take a bath or a shower. Bathing  Do not take baths or swim until your health care provider approves. Ask your health care provider if you can take showers. You may only be allowed to take sponge baths for bathing.  If your cast or splint is not waterproof, cover it with a watertight covering when you take a bath or shower. Managing pain, stiffness, and swelling  Move your fingers or toes often to avoid stiffness and to lessen swelling.  Raise (elevate) the injured area above the level of your heart while sitting or lying down. Safety  Do not use the injured limb to support your body weight until your health care provider says that it is okay.  Use crutches or other assistive devices as told by your health care  provider. General instructions  Do not put pressure on any part of the cast or splint until it is fully hardened. This may take several hours.  Return to your  normal activities as told by your health care provider. Ask your health care provider what activities are safe for you.  Take over-the-counter and prescription medicines only as told by your health care provider.  Keep all follow-up visits as told by your health care provider. This is important. Contact a health care provider if:  Your cast or splint gets damaged.  The skin around the cast gets red or raw.  The skin under the cast is extremely itchy or painful.  Your cast or splint feels very uncomfortable.  Your cast or splint is too tight or too loose.  Your cast becomes wet or it develops a soft spot or area.  You get an object stuck under your cast. Get help right away if:  Your pain is getting worse.  The injured area tingles, becomes numb, or turns cold and blue.  The part of your body above or below the cast is swollen and discolored.  You cannot feel or move your fingers or toes.  There is fluid leaking through the cast.  You have severe pain or pressure under the cast.  You have trouble breathing.  You have shortness of breath.  You have chest pain. This information is not intended to replace advice given to you by your health care provider. Make sure you discuss any questions you have with your health care provider. Document Released: 07/27/2000 Document Revised: 05/27/2017 Document Reviewed: 01/21/2016 Elsevier Patient Education  2020 [place] Maintenance, Female Adopting a healthy lifestyle and getting preventive care are important in promoting health and wellness. Ask your health care provider about:  The right schedule for you to have regular tests and exams.  Things you can do on your own to prevent diseases and keep yourself healthy. What should I know about diet, weight, and exercise? Eat a healthy diet   Eat a diet that includes plenty of vegetables, fruits, low-fat dairy products, and lean protein.  Do not eat a lot of foods  that are high in solid fats, added sugars, or sodium. Maintain a healthy weight Body mass index (BMI) is used to identify weight problems. It estimates body fat based on height and weight. Your health care provider can help determine your BMI and help you achieve or maintain a healthy weight. Get regular exercise Get regular exercise. This is one of the most important things you can do for your health. Most adults should:  Exercise for at least 150 minutes each week. The exercise should increase your heart rate and make you sweat (moderate-intensity exercise).  Do strengthening exercises at least twice a week. This is in addition to the moderate-intensity exercise.  Spend less time sitting. Even light physical activity can be beneficial. Watch cholesterol and blood lipids Have your blood tested for lipids and cholesterol at 74 years of age, then have this test every 5 years. Have your cholesterol levels checked more often if:  Your lipid or cholesterol levels are high.  You are older than 74 years of age.  You are at high risk for heart disease. What should I know about cancer screening? Depending on your health history and family history, you may need to have cancer screening at various ages. This may include screening for:  Breast cancer.  Cervical cancer.  Colorectal cancer.  Skin cancer.  Lung cancer. What should I know about heart disease, diabetes, and high blood pressure? Blood pressure and heart disease  High blood pressure causes heart disease and increases the risk of stroke. This is more likely to develop in people who have high blood pressure readings, are of African descent, or are overweight.  Have your blood pressure checked: ? Every 3-5 years if you are 33-64 years of age. ? Every year if you are 21 years old or older. Diabetes Have regular diabetes screenings. This checks your fasting blood sugar level. Have the screening done:  Once every three years after  age 26 if you are at a normal weight and have a low risk for diabetes.  More often and at a younger age if you are overweight or have a high risk for diabetes. What should I know about preventing infection? Hepatitis B If you have a higher risk for hepatitis B, you should be screened for this virus. Talk with your health care provider to find out if you are at risk for hepatitis B infection. Hepatitis C Testing is recommended for:  Everyone born from [birth year] through 1966.  Anyone with known risk factors for hepatitis C. Sexually transmitted infections (STIs)  Get screened for STIs, including gonorrhea and chlamydia, if: ? You are sexually active and are younger than 74 years of age. ? You are older than 74 years of age and your health care provider tells you that you are at risk for this type of infection. ? Your sexual activity has changed since you were last screened, and you are at increased risk for chlamydia or gonorrhea. Ask your health care provider if you are at risk.  Ask your health care provider about whether you are at high risk for HIV. Your health care provider may recommend a prescription medicine to help prevent HIV infection. If you choose to take medicine to prevent HIV, you should first get tested for HIV. You should then be tested every 3 months for as long as you are taking the medicine. Pregnancy  If you are about to stop having your period (premenopausal) and you may become pregnant, seek counseling before you get pregnant.  Take 400 to 800 micrograms (mcg) of folic acid every day if you become pregnant.  Ask for birth control (contraception) if you want to prevent pregnancy. Osteoporosis and menopause Osteoporosis is a disease in which the bones lose minerals and strength with aging. This can result in bone fractures. If you are 32 years old or older, or if you are at risk for osteoporosis and fractures, ask your health care provider if you should:  Be screened for  bone loss.  Take a calcium or vitamin D supplement to lower your risk of fractures.  Be given hormone replacement therapy (HRT) to treat symptoms of menopause. Follow these instructions at home: Lifestyle  Do not use any products that contain nicotine or tobacco, such as cigarettes, e-cigarettes, and chewing tobacco. If you need help quitting, ask your health care provider.  Do not use street drugs.  Do not share needles.  Ask your health care provider for help if you need support or information about quitting drugs. Alcohol use  Do not drink alcohol if: ? Your health care provider tells you not to drink. ? You are pregnant, may be pregnant, or are planning to become pregnant.  If you drink alcohol: ? Limit how much you use to 0-1 drink a day. ? Limit intake if you are breastfeeding.  Be aware of how much alcohol is in your drink. In the [place] one drink equals one 12 oz bottle of beer (355 mL), one 5 oz glass of wine (148 mL), or one 1 oz glass of hard liquor (44 mL). General instructions  Schedule regular health, dental, and eye exams.  Stay current with your vaccines.  Tell your health care provider if: ? You often feel depressed. ? You have ever been abused or do not feel safe at home. Summary  Adopting a healthy lifestyle and getting preventive care are important in promoting health and wellness.  Follow your health care provider's instructions about healthy diet, exercising, and getting tested or screened for diseases.  Follow your health care provider's instructions on monitoring your cholesterol and blood pressure. This information is not intended to replace advice given to you by your health care provider. Make sure you discuss any questions you have with your health care provider. Document Released: 02/12/2011 Document Revised: 07/23/2018 Document Reviewed: 07/23/2018 Elsevier Patient Education  2020 [place] Anesthesia Blocks  1. Numbness  or the inability to move the "blocked" extremity may last from 3-48 hours after placement. The length of time depends on the medication injected and your individual response to the medication. If the numbness is not going away after 48 hours, call your surgeon.  2. The extremity that is blocked will need to be protected until the numbness is gone and the  Strength has returned. Because you cannot feel it, you will need to take extra care to avoid injury. Because it may be weak, you may have difficulty moving it or using it. You may not know what position it is in without looking at it while the block is in effect.  3. For blocks in the legs and feet, returning to weight bearing and walking needs to be done carefully. You will need to wait until the numbness is entirely gone and the strength has returned. You should be able to move your leg and foot normally before you try and bear weight or walk. You will need someone to be with you when you first try to ensure you do not fall and possibly risk injury.  4. Bruising and tenderness at the needle site are common side effects and will resolve in a few days.  5. Persistent numbness or new problems with movement should be communicated to the surgeon or the [facility] [phone] [place] [phone]). [facility] Instructions Hand Surgery  Wound Care: Keep your hand elevated above the level of your heart.  Do not allow it to dangle by your side.  Keep the dressing dry and do not remove it unless your doctor advises you to do so.  He will usually change it at the time of your post-op visit.  Moving your fingers is advised to stimulate circulation but will depend on the site of your surgery.  If you have a splint applied, your doctor will advise you regarding movement.  Activity: Do not drive or operate machinery today.  Rest today and then you may return to your normal activity and work as indicated by your  physician.  Diet:  Drink liquids today or eat a light diet.  You may resume a regular diet tomorrow.    General expectations: Pain for two to three days. Fingers may become slightly swollen.  Call your doctor if any of the following occur: Severe pain not relieved by pain medication. Elevated temperature. Dressing  soaked with blood. Inability to move fingers. White or bluish color to fingers.

## 2019-05-09 NOTE — Transfer of Care (Signed)
Immediate Anesthesia Transfer of Care Note  Patient: Kelli Swanson  Procedure(s) Performed: OPEN REDUCTION INTERNAL FIXATION (ORIF) DISTAL RADIAL FRACTURE (Right Wrist) IRRIGATION AND DEBRIDEMENT OPEN FRACTURE (Right Wrist)  Patient Location: PACU  Anesthesia Type:General  Level of Consciousness: awake  Airway & Oxygen Therapy: Patient Spontanous Breathing  Post-op Assessment: Report given to RN and Post -op Vital signs reviewed and stable  Post vital signs: Reviewed and stable  Last Vitals:  Vitals Value Taken Time  BP 148/70 05/09/19 1927  Temp    Pulse 94 05/09/19 1928  Resp 12 05/09/19 1928  SpO2 100 % 05/09/19 1928  Vitals shown include unvalidated device data.  Last Pain:  Vitals:   05/09/19 1306  TempSrc:   PainSc: 7          Complications: No apparent anesthesia complications

## 2019-05-09 NOTE — ED Notes (Signed)
IV team at bedside 
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
## 2019-05-09 NOTE — Anesthesia Preprocedure Evaluation (Signed)
Anesthesia Evaluation  Patient identified by MRN, date of birth, ID band Patient awake    Reviewed: Allergy & Precautions, H&P , NPO status , Patient's Chart, lab work & pertinent test results  History of Anesthesia Complications (+) PONV and history of anesthetic complications  Airway Mallampati: I  TM Distance: >3 FB Neck ROM: full    Dental  (+) Teeth Intact, Dental Advidsory Given   Pulmonary neg shortness of breath, neg COPD, neg recent URI,    breath sounds clear to auscultation       Cardiovascular hypertension, On Medications (-) angina(-) Past MI and (-) CHF  Rhythm:Regular     Neuro/Psych PSYCHIATRIC DISORDERS Anxiety  Neuromuscular disease    GI/Hepatic GERD  Medicated and Controlled,  Endo/Other    Renal/GU      Musculoskeletal  (+) Arthritis , distal radius fracture right   Abdominal   Peds  Hematology   Anesthesia Other Findings   Reproductive/Obstetrics                             Anesthesia Physical Anesthesia Plan  ASA: II  Anesthesia Plan: General and Regional   Post-op Pain Management:  Regional for Post-op pain   Induction: Intravenous  PONV Risk Score and Plan: 4 or greater and Ondansetron and Dexamethasone  Airway Management Planned: LMA and Oral ETT  Additional Equipment: None  Intra-op Plan:   Post-operative Plan: Extubation in OR  Informed Consent: I have reviewed the patients History and Physical, chart, labs and discussed the procedure including the risks, benefits and alternatives for the proposed anesthesia with the patient or authorized representative who has indicated his/her understanding and acceptance.     Dental advisory given  Plan Discussed with: CRNA and Surgeon  Anesthesia Plan Comments:         Anesthesia Quick Evaluation
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
## 2019-05-09 NOTE — ED Notes (Signed)
Patient transported to X-ray 

## 2019-05-11 ENCOUNTER — Encounter (HOSPITAL_COMMUNITY): Payer: Self-pay | Admitting: Orthopedic Surgery

## 2019-05-12 NOTE — Anesthesia Postprocedure Evaluation (Signed)
Anesthesia Post Note  Patient: Kelli Swanson  Procedure(s) Performed: OPEN REDUCTION INTERNAL FIXATION (ORIF) DISTAL RADIAL FRACTURE (Right Wrist) IRRIGATION AND DEBRIDEMENT OPEN FRACTURE (Right Wrist)     Patient location during evaluation: PACU Anesthesia Type: Regional and General Level of consciousness: awake and alert Pain management: pain level controlled Vital Signs Assessment: post-procedure vital signs reviewed and stable Respiratory status: spontaneous breathing, nonlabored ventilation, respiratory function stable and patient connected to nasal cannula oxygen Cardiovascular status: blood pressure returned to baseline and stable Postop Assessment: no apparent nausea or vomiting Anesthetic complications: no    Last Vitals:  Vitals:   05/09/19 1930 05/09/19 1954  BP: (!) 148/70 (!) 162/71  Pulse: 94 99  Resp: 10 15  Temp:  36.5 C  SpO2: 97% 96%    Last Pain:  Vitals:   05/09/19 1954  TempSrc:   PainSc: 0-No pain                 Audryna Wendt

## 2019-05-13 NOTE — Addendum Note (Signed)
Addendum  created 05/13/19 1938 by Oleta Mouse, MD   Clinical Note Signed, Intraprocedure Blocks edited

## 2019-06-30 LAB — HM MAMMOGRAPHY

## 2019-07-20 ENCOUNTER — Ambulatory Visit: Payer: Medicare Other | Admitting: Family Medicine

## 2019-07-22 ENCOUNTER — Telehealth: Payer: Self-pay | Admitting: Family Medicine

## 2019-07-22 ENCOUNTER — Other Ambulatory Visit: Payer: Self-pay | Admitting: Family Medicine

## 2019-07-22 DIAGNOSIS — R609 Edema, unspecified: Secondary | ICD-10-CM

## 2019-07-22 NOTE — Telephone Encounter (Signed)
rx refill hydrochlorothiazide (HYDRODIURIL) 12.5 MG tablet  PHARMACY [facility] [id number] - [place], [place] - [address] AT [address]

## 2019-07-22 NOTE — Telephone Encounter (Signed)
Patient called stating patient is having stress regarding her fall she had 9/26.  Patient had to have surgery on her right wrist regarding the fall.  Patient states her BP has been going up, not super high but it has been going up to 128/75.  Patient wanted to give PCP a follow up. Call back [phone]

## 2019-07-23 ENCOUNTER — Other Ambulatory Visit: Payer: Self-pay | Admitting: Family Medicine

## 2019-07-23 DIAGNOSIS — R609 Edema, unspecified: Secondary | ICD-10-CM

## 2019-07-23 MED ORDER — HYDROCHLOROTHIAZIDE 12.5 MG PO TABS: 12.5000 mg | ORAL_TABLET | Freq: Every day | ORAL | 1 refills | Status: DC | PRN

## 2019-07-23 NOTE — Telephone Encounter (Signed)
HCTZ 12.5 mg #30 w/1 refill approved.  Pt last seen 01/2019 and advised to return in 6 mos.  Next appt. 08/2019. Dgaddy, CMA

## 2019-07-23 NOTE — Telephone Encounter (Signed)
Requested medication (s) are due for refill today: yes  Requested medication (s) are on the active medication list: yes  Last refill:  07/23/2019  Future visit scheduled:yes  Notes to clinic:  patient requesting 90 day supply   Requested Prescriptions  Pending Prescriptions Disp Refills   hydrochlorothiazide (HYDRODIURIL) 12.5 MG tablet [Pharmacy Med Name: HYDROCHLOROTHIAZIDE 12.5MG  TABLETS] 90 tablet     Sig: TAKE ONE TABLET BY MOUTH DAILY AS NEEDED      Cardiovascular: Diuretics - Thiazide Failed - 07/23/2019  9:04 AM      Failed - Last BP in normal range    BP Readings from Last 1 Encounters:  05/09/19 (!) 162/71          Failed - Valid encounter within last 6 months    Recent Outpatient Visits           6 months ago Essential hypertension   Primary Care at [facility], [place]   7 months ago Essential hypertension   Primary Care at [facility], Arlie Solomons, MD   9 months ago Peripheral edema   Primary Care at [facility], Arlie Solomons, MD   11 months ago Acute recurrent maxillary sinusitis   Primary Care at [place], [place], MD   1 year ago Allergic rhinitis, unspecified seasonality, unspecified trigger   Primary Care at [facility], Fenton Malling, MD       Future Appointments             In 4 weeks Forrest Moron, MD Primary Care at [place], [place] in normal range and within 360 days    Calcium  Date Value Ref Range Status  05/09/2019 9.3 8.9 - 10.3 mg/dL Final          Passed - Cr in normal range and within 360 days    Creat  Date Value Ref Range Status  04/27/2013 1.08 0.50 - 1.10 mg/dL Final   Creatinine, Ser  Date Value Ref Range Status  05/09/2019 0.90 0.44 - 1.00 mg/dL Final          Passed - K in normal range and within 360 days    Potassium  Date Value Ref Range Status  05/09/2019 3.7 3.5 - 5.1 mmol/L Final          Passed - Na in normal range and within 360 days    Sodium  Date Value Ref Range  Status  05/09/2019 138 135 - 145 mmol/L Final  01/19/2019 140 134 - 144 mmol/L Final

## 2019-07-23 NOTE — Telephone Encounter (Signed)
Fyi -update from pt.

## 2019-08-19 ENCOUNTER — Other Ambulatory Visit: Payer: Self-pay | Admitting: Family Medicine

## 2019-08-19 DIAGNOSIS — R609 Edema, unspecified: Secondary | ICD-10-CM

## 2019-08-19 NOTE — Telephone Encounter (Signed)
Requested medication (s) are due for refill today: yes  Requested medication (s) are on the active medication list: yes  Last refill:  07/23/2019  Future visit scheduled: yes  Notes to clinic:  requesting 90 day supply Patient has appointment on 08/21/2019   Requested Prescriptions  Pending Prescriptions Disp Refills   hydrochlorothiazide (HYDRODIURIL) 12.5 MG tablet [Pharmacy Med Name: HYDROCHLOROTHIAZIDE 12.5MG  TABLETS] 90 tablet     Sig: TAKE ONE TABLET BY MOUTH DAILY AS NEEDED      Cardiovascular: Diuretics - Thiazide Failed - 08/19/2019  7:39 AM      Failed - Last BP in normal range    BP Readings from Last 1 Encounters:  05/09/19 (!) 162/71          Failed - Valid encounter within last 6 months    Recent Outpatient Visits           7 months ago Essential hypertension   Primary Care at [place], MD   8 months ago Essential hypertension   Primary Care at [facility], Arlie Solomons, MD   10 months ago Peripheral edema   Primary Care at [place], MD   1 year ago Acute recurrent maxillary sinusitis   Primary Care at [place], [place], MD   1 year ago Allergic rhinitis, unspecified seasonality, unspecified trigger   Primary Care at [facility], Fenton Malling, MD       Future Appointments             In 2 days Forrest Moron, MD Primary Care at [place], [place] in normal range and within 360 days    Calcium  Date Value Ref Range Status  05/09/2019 9.3 8.9 - 10.3 mg/dL Final          Passed - Cr in normal range and within 360 days    Creat  Date Value Ref Range Status  04/27/2013 1.08 0.50 - 1.10 mg/dL Final   Creatinine, Ser  Date Value Ref Range Status  05/09/2019 0.90 0.44 - 1.00 mg/dL Final   Creatinine, Urine  Date Value Ref Range Status  03/26/2013 117.63 mg/dL Final          Passed - K in normal range and within 360 days    Potassium  Date Value Ref Range Status  05/09/2019 3.7 3.5 - 5.1  mmol/L Final          Passed - Na in normal range and within 360 days    Sodium  Date Value Ref Range Status  05/09/2019 138 135 - 145 mmol/L Final  01/19/2019 140 134 - 144 mmol/L Final

## 2019-08-21 ENCOUNTER — Ambulatory Visit: Payer: Medicare Other | Admitting: Family Medicine

## 2019-08-26 ENCOUNTER — Other Ambulatory Visit: Payer: Self-pay | Admitting: Family Medicine

## 2019-08-26 ENCOUNTER — Ambulatory Visit (INDEPENDENT_AMBULATORY_CARE_PROVIDER_SITE_OTHER): Payer: Medicare Other | Admitting: Family Medicine

## 2019-08-26 ENCOUNTER — Other Ambulatory Visit: Payer: Self-pay

## 2019-08-26 ENCOUNTER — Encounter: Payer: Self-pay | Admitting: Family Medicine

## 2019-08-26 VITALS — BP 154/84 | HR 58 | Temp 98.2°F | Resp 17 | Ht 65.5 in | Wt 164.4 lb

## 2019-08-26 DIAGNOSIS — Z636 Dependent relative needing care at home: Secondary | ICD-10-CM

## 2019-08-26 DIAGNOSIS — M8080XS Other osteoporosis with current pathological fracture, unspecified site, sequela: Secondary | ICD-10-CM | POA: Diagnosis not present

## 2019-08-26 DIAGNOSIS — I10 Essential (primary) hypertension: Secondary | ICD-10-CM | POA: Diagnosis not present

## 2019-08-26 DIAGNOSIS — E782 Mixed hyperlipidemia: Secondary | ICD-10-CM

## 2019-08-26 DIAGNOSIS — F5101 Primary insomnia: Secondary | ICD-10-CM

## 2019-08-26 DIAGNOSIS — Z1211 Encounter for screening for malignant neoplasm of colon: Secondary | ICD-10-CM

## 2019-08-26 DIAGNOSIS — E2839 Other primary ovarian failure: Secondary | ICD-10-CM | POA: Diagnosis not present

## 2019-08-26 DIAGNOSIS — R600 Localized edema: Secondary | ICD-10-CM

## 2019-08-26 DIAGNOSIS — R609 Edema, unspecified: Secondary | ICD-10-CM

## 2019-08-26 DIAGNOSIS — E559 Vitamin D deficiency, unspecified: Secondary | ICD-10-CM

## 2019-08-26 MED ORDER — HYDROCHLOROTHIAZIDE 12.5 MG PO TABS: 12.5000 mg | ORAL_TABLET | Freq: Every day | ORAL | 1 refills | Status: DC | PRN
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
## 2019-08-26 NOTE — Telephone Encounter (Signed)
Requested medication (s) are due for refill today if to continue  Requested medication (s) are on the active medication list -no  Future visit scheduled -no  Last refill: 07/29/19  Notes to clinic: Patient is requesting medication discontinued per pharmacy note: The original prescription was discontinued on 05/08/2019 by Chestnutt, Ammie L, RMA. Renewing this prescription may not be appropriate. Sent for PCP review of request.  Requested Prescriptions  Pending Prescriptions Disp Refills   traZODone (DESYREL) 150 MG tablet [Pharmacy Med Name: TRAZODONE 150MG  (HUNDRED-FIFTY) TAB] 30 tablet 3    Sig: TAKE 1/2 TABLET(75 MG) BY MOUTH TWICE DAILY      Psychiatry: Antidepressants - Serotonin Modulator Passed - 08/26/2019  9:24 AM      Passed - Valid encounter within last 6 months    Recent Outpatient Visits           7 months ago Essential hypertension   Primary Care at [facility], Arlie Solomons, MD   8 months ago Essential hypertension   Primary Care at [facility], Arlie Solomons, MD   11 months ago Peripheral edema   Primary Care at [facility], Arlie Solomons, MD   1 year ago Acute recurrent maxillary sinusitis   Primary Care at [facility], Ines Bloomer, MD   1 year ago Allergic rhinitis, unspecified seasonality, unspecified trigger   Primary Care at [facility], Fenton Malling, MD                  Requested Prescriptions  Pending Prescriptions Disp Refills   traZODone (DESYREL) 150 MG tablet [Pharmacy Med Name: TRAZODONE 150MG  (HUNDRED-FIFTY) TAB] 30 tablet 3    Sig: TAKE 1/2 TABLET(75 MG) BY MOUTH TWICE DAILY      Psychiatry: Antidepressants - Serotonin Modulator Passed - 08/26/2019  9:24 AM      Passed - Valid encounter within last 6 months    Recent Outpatient Visits           7 months ago Essential hypertension   Primary Care at [facility], Arlie Solomons, MD   8 months ago Essential hypertension   Primary Care at [facility], Arlie Solomons, MD   11 months ago Peripheral edema    Primary Care at [facility], Arlie Solomons, MD   1 year ago Acute recurrent maxillary sinusitis   Primary Care at [place], Ines Bloomer, MD   1 year ago Allergic rhinitis, unspecified seasonality, unspecified trigger   Primary Care at [facility], Fenton Malling, MD

## 2019-08-26 NOTE — Telephone Encounter (Signed)
Patient is requesting a refill of the following medications: Requested Prescriptions   Pending Prescriptions Disp Refills   traZODone (DESYREL) 150 MG tablet [Pharmacy Med Name: TRAZODONE 150MG  (HUNDRED-FIFTY) TAB] 30 tablet 3    Sig: TAKE 1/2 TABLET(75 MG) BY MOUTH TWICE DAILY    Date of patient request: 08/26/19 Last office visit: 08/26/19 Date of last refill: 04/27/19 Last refill amount: 30-1rf Follow up time period per chart: 02/26/20

## 2019-08-26 NOTE — Patient Instructions (Signed)
° ° ° °  If you have lab work done today you will be contacted with your lab results within the next 2 weeks.  If you have not heard from us then please contact us. The fastest way to get your results is to register for My Chart. ° ° °IF you received an x-ray today, you will receive an invoice from [facility]. Please contact [facility] at [phone] with questions or concerns regarding your invoice.  ° °IF you received labwork today, you will receive an invoice from [facility]. Please contact [facility] at [phone] with questions or concerns regarding your invoice.  ° °Our billing staff will not be able to assist you with questions regarding bills from these companies. ° °You will be contacted with the lab results as soon as they are available. The fastest way to get your results is to activate your My Chart account. Instructions are located on the last page of this paperwork. If you have not heard from us regarding the results in 2 weeks, please contact this office. °  ° ° ° °

## 2019-08-27 LAB — CMP14+EGFR
ALT: 12 [IU]/L (ref 0–32)
AST: 23 [IU]/L (ref 0–40)
Albumin/Globulin Ratio: 2.1 (ref 1.2–2.2)
Albumin: 4.7 g/dL (ref 3.7–4.7)
Alkaline Phosphatase: 87 [IU]/L (ref 39–117)
BUN/Creatinine Ratio: 17 (ref 12–28)
BUN: 17 mg/dL (ref 8–27)
Bilirubin Total: 0.5 mg/dL (ref 0.0–1.2)
CO2: 23 mmol/L (ref 20–29)
Calcium: 9.9 mg/dL (ref 8.7–10.3)
Chloride: 99 mmol/L (ref 96–106)
Creatinine, Ser: 0.98 mg/dL (ref 0.57–1.00)
GFR calc Af Amer: 66 mL/min/{1.73_m2}
GFR calc non Af Amer: 57 mL/min/{1.73_m2} — ABNORMAL LOW
Globulin, Total: 2.2 g/dL (ref 1.5–4.5)
Glucose: 88 mg/dL (ref 65–99)
Potassium: 4.6 mmol/L (ref 3.5–5.2)
Sodium: 141 mmol/L (ref 134–144)
Total Protein: 6.9 g/dL (ref 6.0–8.5)

## 2019-08-27 LAB — LIPID PANEL
Chol/HDL Ratio: 2.3 ratio (ref 0.0–4.4)
Cholesterol, Total: 268 mg/dL — ABNORMAL HIGH (ref 100–199)
HDL: 116 mg/dL
LDL Chol Calc (NIH): 140 mg/dL — ABNORMAL HIGH (ref 0–99)
Triglycerides: 76 mg/dL (ref 0–149)
VLDL Cholesterol Cal: 12 mg/dL (ref 5–40)

## 2019-08-27 LAB — VITAMIN D 25 HYDROXY (VIT D DEFICIENCY, FRACTURES): Vit D, 25-Hydroxy: 25.5 ng/mL — ABNORMAL LOW (ref 30.0–100.0)

## 2019-08-29 ENCOUNTER — Telehealth: Payer: Medicare Other | Admitting: Physician Assistant

## 2019-08-29 DIAGNOSIS — M546 Pain in thoracic spine: Secondary | ICD-10-CM | POA: Diagnosis not present

## 2019-08-29 MED ORDER — CYCLOBENZAPRINE HCL 5 MG PO TABS: 5.0000 mg | ORAL_TABLET | Freq: Three times a day (TID) | ORAL | 0 refills | Status: DC | PRN

## 2019-08-29 NOTE — Progress Notes (Signed)
We are sorry that you are not feeling well.  Here is how we plan to help!  Based on what you have shared with me it looks like you mostly have acute back pain.  Acute back pain is defined as musculoskeletal pain that can resolve in 1-3 weeks with conservative treatment.  I have prescribed  Flexeril 10 mg every eight hours as needed which is a muscle relaxer.  Please keep in mind that muscle relaxer's can cause fatigue and should not be taken while at work or driving.  Back pain is very common.  The pain often gets better over time.  The cause of back pain is usually not dangerous.  Most people can learn to manage their back pain on their own.  Home Care  Stay active.  Start with short walks on flat ground if you can.  Try to walk farther each day.  Do not sit, drive or stand in one place for more than 30 minutes.  Do not stay in bed.  Do not avoid exercise or work.  Activity can help your back heal faster.  Be careful when you bend or lift an object.  Bend at your knees, keep the object close to you, and do not twist.  Sleep on a firm mattress.  Lie on your side, and bend your knees.  If you lie on your back, put a pillow under your knees.  Only take medicines as told by your doctor.  Put ice on the injured area.  Put ice in a plastic bag  Place a towel between your skin and the bag  Leave the ice on for 15-20 minutes, 3-4 times a day for the first 2-3 days. 210 After that, you can switch between ice and heat packs.  Ask your doctor about back exercises or massage.  Avoid feeling anxious or stressed.  Find good ways to deal with stress, such as exercise.  Get Help Right Way If:  Your pain does not go away with rest or medicine.  Your pain does not go away in 1 week.  You have new problems.  You do not feel well.  The pain spreads into your legs.  You cannot control when you poop (bowel movement) or pee (urinate)  You feel sick to your stomach (nauseous) or throw up  (vomit)  You have belly (abdominal) pain.  You feel like you may pass out (faint).  If you develop a fever.  Make Sure you:  Understand these instructions.  Will watch your condition  Will get help right away if you are not doing well or get worse.  Your e-visit answers were reviewed by a board certified advanced clinical practitioner to complete your personal care plan.  Depending on the condition, your plan could have included both over the counter or prescription medications.  If there is a problem please reply  once you have received a response from your provider.  Your safety is important to [place].  If you have drug allergies check your prescription carefully.    You can use MyChart to ask questions about today's visit, request a non-urgent call back, or ask for a work or school excuse for 24 hours related to this e-Visit. If it has been greater than 24 hours you will need to follow up with your provider, or enter a new e-Visit to address those concerns.  You will get an e-mail in the next two days asking about your experience.  I hope that your e-visit has  been valuable and will speed your recovery. Thank you for using e-visits.  Particia Nearing PA-C  Approximately 5 minutes was spent documenting and reviewing patient's chart.

## 2019-09-07 LAB — COLOGUARD: Cologuard: NEGATIVE

## 2019-09-14 ENCOUNTER — Encounter: Payer: Self-pay | Admitting: Family Medicine

## 2019-10-28 ENCOUNTER — Telehealth: Payer: Medicare Other | Admitting: Nurse Practitioner

## 2019-10-28 DIAGNOSIS — J0100 Acute maxillary sinusitis, unspecified: Secondary | ICD-10-CM | POA: Diagnosis not present

## 2019-10-28 MED ORDER — CEFDINIR 300 MG PO CAPS: 300.0000 mg | ORAL_CAPSULE | Freq: Two times a day (BID) | ORAL | 0 refills | Status: DC

## 2019-10-28 NOTE — Progress Notes (Signed)
We are sorry that you are not feeling well.  Here is how we plan to help!  Based on what you have shared with me it looks like you have sinusitis.  Sinusitis is inflammation and infection in the sinus cavities of the head.  Based on your presentation I believe you most likely have Acute Bacterial Sinusitis.  This is an infection caused by bacteria and is treated with antibiotics. I have prescribed omnicef 300mg  1 po 2x a day for 7 days You may use an oral decongestant such as Mucinex D or if you have glaucoma or high blood pressure use plain Mucinex. Saline nasal spray help and can safely be used as often as needed for congestion.  If you develop worsening sinus pain, fever or notice severe headache and vision changes, or if symptoms are not better after completion of antibiotic, please schedule an appointment with a health care provider.    Sinus infections are not as easily transmitted as other respiratory infection, however we still recommend that you avoid close contact with loved ones, especially the very young and elderly.  Remember to wash your hands thoroughly throughout the day as this is the number one way to prevent the spread of infection!  Home Care:  Only take medications as instructed by your medical team.  Complete the entire course of an antibiotic.  Do not take these medications with alcohol.  A steam or ultrasonic humidifier can help congestion.  You can place a towel over your head and breathe in the steam from hot water coming from a faucet.  Avoid close contacts especially the very young and the elderly.  Cover your mouth when you cough or sneeze.  Always remember to wash your hands.  Get Help Right Away If:  You develop worsening fever or sinus pain.  You develop a severe head ache or visual changes.  Your symptoms persist after you have completed your treatment plan.  Make sure you  Understand these instructions.  Will watch your condition.  Will get help  right away if you are not doing well or get worse.  Your e-visit answers were reviewed by a board certified advanced clinical practitioner to complete your personal care plan.  Depending on the condition, your plan could have included both over the counter or prescription medications.  If there is a problem please reply  once you have received a response from your provider.  Your safety is important to [place].  If you have drug allergies check your prescription carefully.    You can use MyChart to ask questions about today's visit, request a non-urgent call back, or ask for a work or school excuse for 24 hours related to this e-Visit. If it has been greater than 24 hours you will need to follow up with your provider, or enter a new e-Visit to address those concerns.  You will get an e-mail in the next two days asking about your experience.  I hope that your e-visit has been valuable and will speed your recovery. Thank you for using e-visits.   5-10 minutes spent reviewing and documenting in chart.

## 2020-02-04 ENCOUNTER — Telehealth: Payer: Medicare Other | Admitting: Physician Assistant

## 2020-02-04 DIAGNOSIS — J019 Acute sinusitis, unspecified: Secondary | ICD-10-CM | POA: Diagnosis not present

## 2020-02-04 MED ORDER — CEFDINIR 300 MG PO CAPS: 300.0000 mg | ORAL_CAPSULE | Freq: Two times a day (BID) | ORAL | 0 refills | Status: AC

## 2020-02-04 NOTE — Progress Notes (Signed)
We are sorry that you are not feeling well.  Here is how we plan to help!  Based on what you have shared with me it looks like you have sinusitis.  Sinusitis is inflammation and infection in the sinus cavities of the head.  Based on your presentation I believe you most likely have Acute Bacterial Sinusitis.  This is an infection caused by bacteria and is treated with antibiotics. I have prescribed Omnicef 300mg  twice daily for 10 days. You may use an oral decongestant such as Mucinex D or if you have glaucoma or high blood pressure use plain Mucinex. Saline nasal spray help and can safely be used as often as needed for congestion.  If you develop worsening sinus pain, fever or notice severe headache and vision changes, or if symptoms are not better after completion of antibiotic, please schedule an appointment with a health care provider.    Sinus infections are not as easily transmitted as other respiratory infection, however we still recommend that you avoid close contact with loved ones, especially the very young and elderly.  Remember to wash your hands thoroughly throughout the day as this is the number one way to prevent the spread of infection!  Home Care:  Only take medications as instructed by your medical team.  Complete the entire course of an antibiotic.  Do not take these medications with alcohol.  A steam or ultrasonic humidifier can help congestion.  You can place a towel over your head and breathe in the steam from hot water coming from a faucet.  Avoid close contacts especially the very young and the elderly.  Cover your mouth when you cough or sneeze.  Always remember to wash your hands.  Get Help Right Away If:  You develop worsening fever or sinus pain.  You develop a severe head ache or visual changes.  Your symptoms persist after you have completed your treatment plan.  Make sure you  Understand these instructions.  Will watch your condition.  Will get help  right away if you are not doing well or get worse.  Your e-visit answers were reviewed by a board certified advanced clinical practitioner to complete your personal care plan.  Depending on the condition, your plan could have included both over the counter or prescription medications.  If there is a problem please reply  once you have received a response from your provider.  Your safety is important to [place].  If you have drug allergies check your prescription carefully.    You can use MyChart to ask questions about today's visit, request a non-urgent call back, or ask for a work or school excuse for 24 hours related to this e-Visit. If it has been greater than 24 hours you will need to follow up with your provider, or enter a new e-Visit to address those concerns.  You will get an e-mail in the next two days asking about your experience.  I hope that your e-visit has been valuable and will speed your recovery. Thank you for using e-visits.  Approximately 5 minutes was spent documenting and reviewing patient's chart.

## 2020-02-24 DIAGNOSIS — E559 Vitamin D deficiency, unspecified: Secondary | ICD-10-CM | POA: Diagnosis not present

## 2020-02-24 DIAGNOSIS — M810 Age-related osteoporosis without current pathological fracture: Secondary | ICD-10-CM | POA: Diagnosis not present

## 2020-02-24 DIAGNOSIS — R5383 Other fatigue: Secondary | ICD-10-CM | POA: Diagnosis not present

## 2020-02-26 ENCOUNTER — Ambulatory Visit: Payer: Medicare Other | Admitting: Family Medicine

## 2020-03-12 ENCOUNTER — Telehealth: Payer: Medicare Other | Admitting: Nurse Practitioner

## 2020-03-12 DIAGNOSIS — M545 Low back pain, unspecified: Secondary | ICD-10-CM

## 2020-03-12 MED ORDER — CYCLOBENZAPRINE HCL 10 MG PO TABS: 10.0000 mg | ORAL_TABLET | Freq: Three times a day (TID) | ORAL | 1 refills | Status: DC | PRN

## 2020-03-12 NOTE — Progress Notes (Signed)

## 2020-03-24 ENCOUNTER — Other Ambulatory Visit: Payer: Self-pay

## 2020-03-24 ENCOUNTER — Ambulatory Visit (INDEPENDENT_AMBULATORY_CARE_PROVIDER_SITE_OTHER): Payer: Medicare Other | Admitting: Emergency Medicine

## 2020-03-24 VITALS — BP 123/74 | HR 75 | Temp 97.9°F | Resp 16 | Ht 65.0 in | Wt 182.0 lb

## 2020-03-24 DIAGNOSIS — F5101 Primary insomnia: Secondary | ICD-10-CM | POA: Diagnosis not present

## 2020-03-24 DIAGNOSIS — I10 Essential (primary) hypertension: Secondary | ICD-10-CM | POA: Diagnosis not present

## 2020-03-24 DIAGNOSIS — Z7689 Persons encountering health services in other specified circumstances: Secondary | ICD-10-CM | POA: Diagnosis not present

## 2020-03-24 DIAGNOSIS — Z8739 Personal history of other diseases of the musculoskeletal system and connective tissue: Secondary | ICD-10-CM | POA: Diagnosis not present

## 2020-03-24 DIAGNOSIS — R609 Edema, unspecified: Secondary | ICD-10-CM | POA: Diagnosis not present

## 2020-03-24 MED ORDER — HYDROCHLOROTHIAZIDE 12.5 MG PO TABS: 12.5000 mg | ORAL_TABLET | Freq: Every day | ORAL | 3 refills | Status: DC | PRN

## 2020-03-24 MED ORDER — TRAZODONE HCL 150 MG PO TABS: 75.0000 mg | ORAL_TABLET | Freq: Two times a day (BID) | ORAL | 3 refills | Status: DC

## 2020-03-24 NOTE — Patient Instructions (Addendum)
   If you have lab work done today you will be contacted with your lab results within the next 2 weeks.  If you have not heard from us then please contact us. The fastest way to get your results is to register for My Chart.   IF you received an x-ray today, you will receive an invoice from [facility]. Please contact [facility] at [phone] with questions or concerns regarding your invoice.   IF you received labwork today, you will receive an invoice from [facility]. Please contact [facility] at [phone] with questions or concerns regarding your invoice.   Our billing staff will not be able to assist you with questions regarding bills from these companies.  You will be contacted with the lab results as soon as they are available. The fastest way to get your results is to activate your My Chart account. Instructions are located on the last page of this paperwork. If you have not heard from us regarding the results in 2 weeks, please contact this office.     Health Maintenance After Age 65 After age 65, you are at a higher risk for certain long-term diseases and infections as well as injuries from falls. Falls are a major cause of broken bones and head injuries in people who are older than age 65. Getting regular preventive care can help to keep you healthy and well. Preventive care includes getting regular testing and making lifestyle changes as recommended by your health care provider. Talk with your health care provider about:  Which screenings and tests you should have. A screening is a test that checks for a disease when you have no symptoms.  A diet and exercise plan that is right for you. What should I know about screenings and tests to prevent falls? Screening and testing are the best ways to find a health problem early. Early diagnosis and treatment give you the best chance of managing medical conditions that are common after age 65. Certain conditions and  lifestyle choices may make you more likely to have a fall. Your health care provider may recommend:  Regular vision checks. Poor vision and conditions such as cataracts can make you more likely to have a fall. If you wear glasses, make sure to get your prescription updated if your vision changes.  Medicine review. Work with your health care provider to regularly review all of the medicines you are taking, including over-the-counter medicines. Ask your health care provider about any side effects that may make you more likely to have a fall. Tell your health care provider if any medicines that you take make you feel dizzy or sleepy.  Osteoporosis screening. Osteoporosis is a condition that causes the bones to get weaker. This can make the bones weak and cause them to break more easily.  Blood pressure screening. Blood pressure changes and medicines to control blood pressure can make you feel dizzy.  Strength and balance checks. Your health care provider may recommend certain tests to check your strength and balance while standing, walking, or changing positions.  Foot health exam. Foot pain and numbness, as well as not wearing proper footwear, can make you more likely to have a fall.  Depression screening. You may be more likely to have a fall if you have a fear of falling, feel emotionally low, or feel unable to do activities that you used to do.  Alcohol use screening. Using too much alcohol can affect your balance and may make you more likely to   have a fall. What actions can I take to lower my risk of falls? General instructions  Talk with your health care provider about your risks for falling. Tell your health care provider if: ? You fall. Be sure to tell your health care provider about all falls, even ones that seem minor. ? You feel dizzy, sleepy, or off-balance.  Take over-the-counter and prescription medicines only as told by your health care provider. These include any  supplements.  Eat a healthy diet and maintain a healthy weight. A healthy diet includes low-fat dairy products, low-fat (lean) meats, and fiber from whole grains, beans, and lots of fruits and vegetables. Home safety  Remove any tripping hazards, such as rugs, cords, and clutter.  Install safety equipment such as grab bars in bathrooms and safety rails on stairs.  Keep rooms and walkways well-lit. Activity   Follow a regular exercise program to stay fit. This will help you maintain your balance. Ask your health care provider what types of exercise are appropriate for you.  If you need a cane or walker, use it as recommended by your health care provider.  Wear supportive shoes that have nonskid soles. Lifestyle  Do not drink alcohol if your health care provider tells you not to drink.  If you drink alcohol, limit how much you have: ? 0-1 drink a day for women. ? 0-2 drinks a day for men.  Be aware of how much alcohol is in your drink. In the [place] one drink equals one typical bottle of beer (12 oz), one-half glass of wine (5 oz), or one shot of hard liquor (1 oz).  Do not use any products that contain nicotine or tobacco, such as cigarettes and e-cigarettes. If you need help quitting, ask your health care provider. Summary  Having a healthy lifestyle and getting preventive care can help to protect your health and wellness after age 65.  Screening and testing are the best way to find a health problem early and help you avoid having a fall. Early diagnosis and treatment give you the best chance for managing medical conditions that are more common for people who are older than age 65.  Falls are a major cause of broken bones and head injuries in people who are older than age 65. Take precautions to prevent a fall at home.  Work with your health care provider to learn what changes you can make to improve your health and wellness and to prevent falls. This information is not intended  to replace advice given to you by your health care provider. Make sure you discuss any questions you have with your health care provider. Document Revised: 11/20/2018 Document Reviewed: 06/12/2017 [employer] Patient Education  2020 [employer]  
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
## 2020-04-25 DIAGNOSIS — M533 Sacrococcygeal disorders, not elsewhere classified: Secondary | ICD-10-CM | POA: Diagnosis not present

## 2020-04-26 DIAGNOSIS — M533 Sacrococcygeal disorders, not elsewhere classified: Secondary | ICD-10-CM | POA: Diagnosis not present

## 2020-05-10 DIAGNOSIS — Z96651 Presence of right artificial knee joint: Secondary | ICD-10-CM | POA: Diagnosis not present

## 2020-05-10 DIAGNOSIS — Z471 Aftercare following joint replacement surgery: Secondary | ICD-10-CM | POA: Diagnosis not present

## 2020-05-31 DIAGNOSIS — M533 Sacrococcygeal disorders, not elsewhere classified: Secondary | ICD-10-CM | POA: Diagnosis not present

## 2020-05-31 DIAGNOSIS — M47816 Spondylosis without myelopathy or radiculopathy, lumbar region: Secondary | ICD-10-CM | POA: Diagnosis not present

## 2020-06-02 DIAGNOSIS — M810 Age-related osteoporosis without current pathological fracture: Secondary | ICD-10-CM | POA: Diagnosis not present

## 2020-06-02 DIAGNOSIS — E559 Vitamin D deficiency, unspecified: Secondary | ICD-10-CM | POA: Diagnosis not present

## 2020-06-28 DIAGNOSIS — M47816 Spondylosis without myelopathy or radiculopathy, lumbar region: Secondary | ICD-10-CM | POA: Diagnosis not present

## 2020-07-10 ENCOUNTER — Telehealth: Payer: Medicare Other | Admitting: Physician Assistant

## 2020-07-10 ENCOUNTER — Encounter: Payer: Self-pay | Admitting: Physician Assistant

## 2020-07-10 DIAGNOSIS — M545 Low back pain, unspecified: Secondary | ICD-10-CM

## 2020-07-10 MED ORDER — TIZANIDINE HCL 2 MG PO CAPS: 2.0000 mg | ORAL_CAPSULE | Freq: Three times a day (TID) | ORAL | 0 refills | Status: DC | PRN

## 2020-07-10 NOTE — Progress Notes (Signed)
We are sorry that you are not feeling well.  Here is how we plan to help!  Based on what you have shared with me it looks like you mostly have acute back pain.  Acute back pain is defined as musculoskeletal pain that can resolve in 1-3 weeks with conservative treatment.  I have prescribedTizanidine 2 mg every eight hours as needed which is a muscle relaxer . It appears that you may have allergy to NSAIDs. Please take Tylenol 500 mg, 2 pills three times daily as needed for pain. Some patients experience stomach irritation or in increased heartburn with anti-inflammatory drugs.  Please keep in mind that muscle relaxer's can cause fatigue and should not be taken while at work or driving.  Back pain is very common.  The pain often gets better over time.  The cause of back pain is usually not dangerous.  Most people can learn to manage their back pain on their own.  Home Care  Stay active.  Start with short walks on flat ground if you can.  Try to walk farther each day.  Do not sit, drive or stand in one place for more than 30 minutes.  Do not stay in bed.  Do not avoid exercise or work.  Activity can help your back heal faster.  Be careful when you bend or lift an object.  Bend at your knees, keep the object close to you, and do not twist.  Sleep on a firm mattress.  Lie on your side, and bend your knees.  If you lie on your back, put a pillow under your knees.  Only take medicines as told by your doctor.  Put ice on the injured area.  Put ice in a plastic bag  Place a towel between your skin and the bag  Leave the ice on for 15-20 minutes, 3-4 times a day for the first 2-3 days. 210 After that, you can switch between ice and heat packs.  Ask your doctor about back exercises or massage.  Avoid feeling anxious or stressed.  Find good ways to deal with stress, such as exercise.  Get Help Right Way If:  Your pain does not go away with rest or medicine.  Your pain does not go away in 1  week.  You have new problems.  You do not feel well.  The pain spreads into your legs.  You cannot control when you poop (bowel movement) or pee (urinate)  You feel sick to your stomach (nauseous) or throw up (vomit)  You have belly (abdominal) pain.  You feel like you may pass out (faint).  If you develop a fever.  Make Sure you:  Understand these instructions.  Will watch your condition  Will get help right away if you are not doing well or get worse.  Your e-visit answers were reviewed by a board certified advanced clinical practitioner to complete your personal care plan.  Depending on the condition, your plan could have included both over the counter or prescription medications.  If there is a problem please reply  once you have received a response from your provider.  Your safety is important to [place].  If you have drug allergies check your prescription carefully.    You can use MyChart to ask questions about todays visit, request a non-urgent call back, or ask for a work or school excuse for 24 hours related to this e-Visit. If it has been greater than 24 hours you will need to follow up  with your provider, or enter a new e-Visit to address those concerns.  You will get an e-mail in the next two days asking about your experience.  I hope that your e-visit has been valuable and will speed your recovery. Thank you for using e-visits.  I spent 5-10 minutes on review and completion of this note- Lacy Duverney [facility]

## 2020-08-02 DIAGNOSIS — Z1231 Encounter for screening mammogram for malignant neoplasm of breast: Secondary | ICD-10-CM | POA: Diagnosis not present

## 2020-08-02 LAB — HM MAMMOGRAPHY

## 2020-08-10 ENCOUNTER — Telehealth: Payer: Self-pay | Admitting: *Deleted

## 2020-08-10 NOTE — Telephone Encounter (Signed)
Faxed signed order to [facility]. Confirmation 1:39 pm.

## 2020-08-16 DIAGNOSIS — R928 Other abnormal and inconclusive findings on diagnostic imaging of breast: Secondary | ICD-10-CM | POA: Diagnosis not present

## 2020-08-16 DIAGNOSIS — R922 Inconclusive mammogram: Secondary | ICD-10-CM | POA: Diagnosis not present

## 2020-08-16 LAB — HM MAMMOGRAPHY

## 2020-08-17 ENCOUNTER — Encounter: Payer: Self-pay | Admitting: *Deleted

## 2020-09-06 DIAGNOSIS — M47816 Spondylosis without myelopathy or radiculopathy, lumbar region: Secondary | ICD-10-CM | POA: Diagnosis not present

## 2020-09-06 DIAGNOSIS — M533 Sacrococcygeal disorders, not elsewhere classified: Secondary | ICD-10-CM | POA: Diagnosis not present

## 2020-09-09 IMAGING — CR DG CHEST 1V
1 series · 1 of 1 positions shown · non-contrast
Comparison: 11/12/2016

CLINICAL DATA: Preoperative

EXAM:
CHEST  1 VIEW

[chest ap]
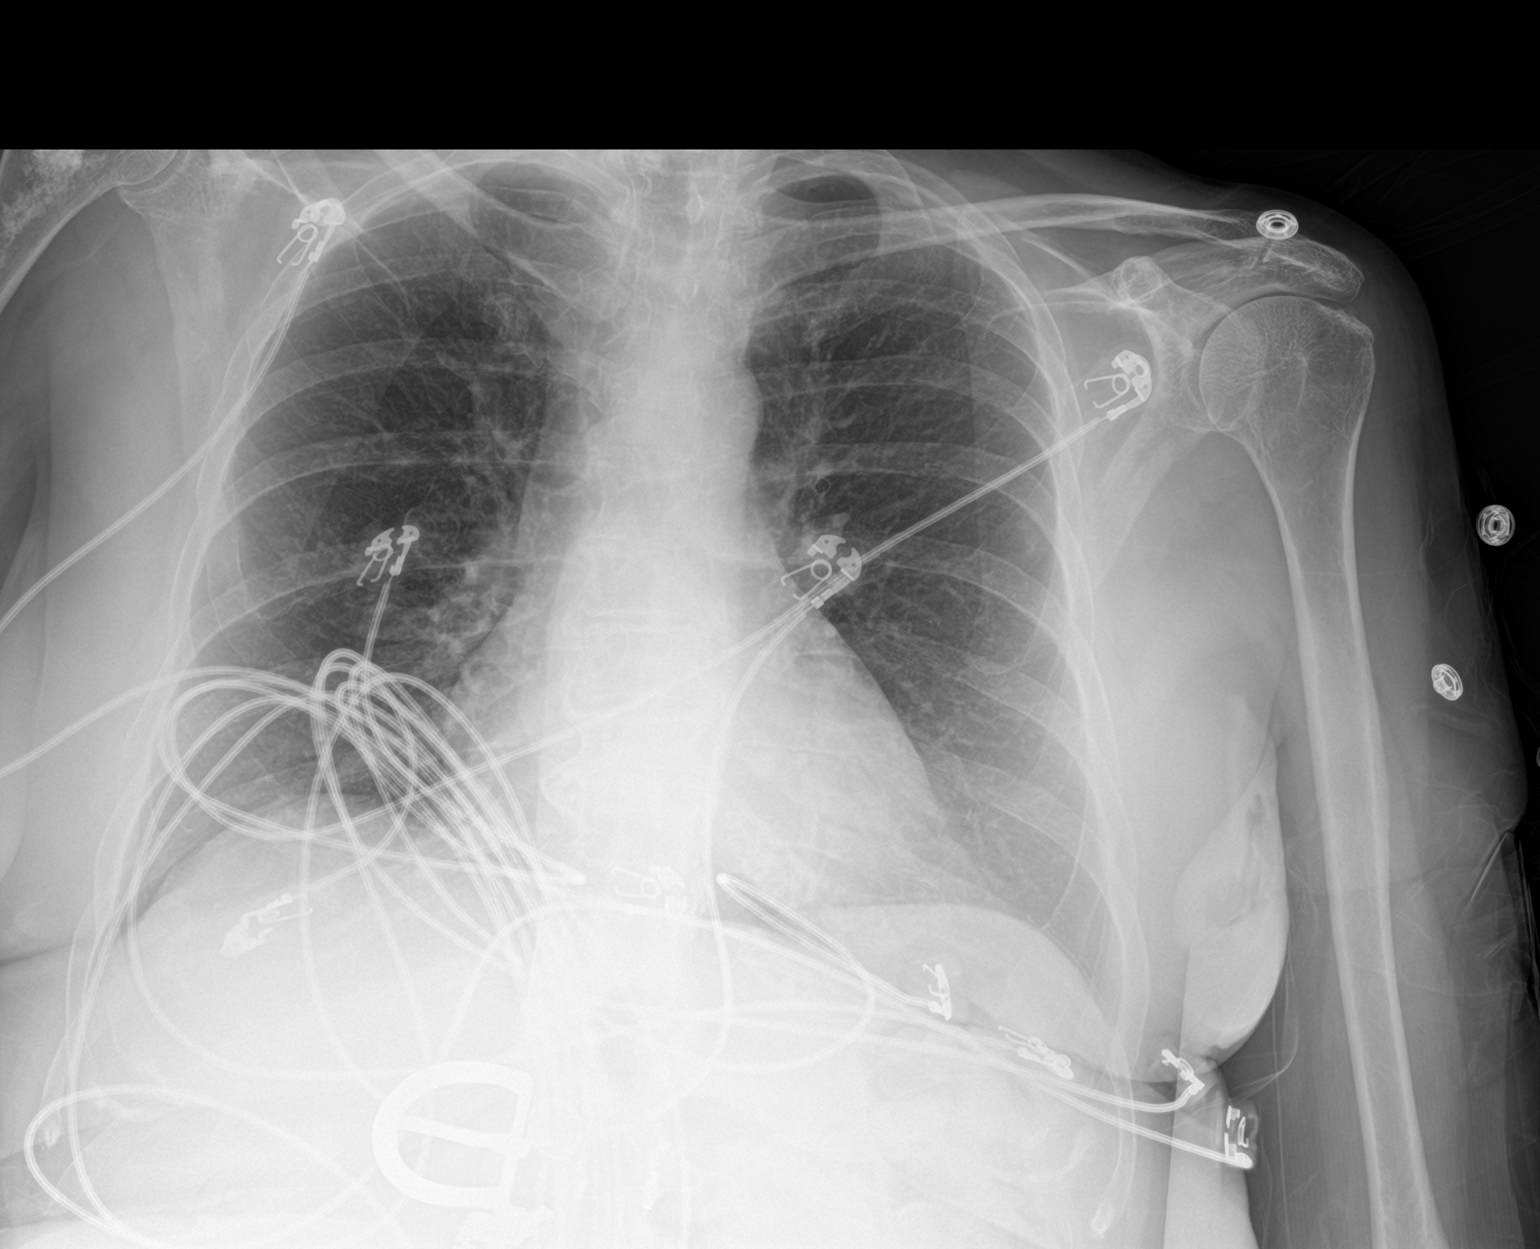

[1 of 1 positions shown; findings below may reference images not displayed]

FINDINGS: The heart size and mediastinal contours are within normal limits.
Both lungs are clear. Incidental note of an unchanged enchondroma of
the proximal right humerus.
IMPRESSION: No acute abnormality of the lungs in AP portable projection.

## 2020-09-13 ENCOUNTER — Telehealth: Payer: Medicare Other | Admitting: Emergency Medicine

## 2020-09-13 DIAGNOSIS — J329 Chronic sinusitis, unspecified: Secondary | ICD-10-CM

## 2020-09-13 MED ORDER — AZITHROMYCIN 250 MG PO TABS: ORAL_TABLET | ORAL | 0 refills | Status: DC

## 2020-09-13 NOTE — Progress Notes (Signed)
We are sorry that you are not feeling well.  Here is how we plan to help!  Based on what you have shared with me it looks like you have sinusitis.  Sinusitis is inflammation and infection in the sinus cavities of the head.  Based on your presentation I believe you most likely have Acute Bacterial Sinusitis.  This is an infection caused by bacteria and is treated with antibiotics. I have prescribed a Z-pak. You may use an oral decongestant such as Mucinex D or if you have glaucoma or high blood pressure use plain Mucinex. Saline nasal spray help and can safely be used as often as needed for congestion.  If you develop worsening sinus pain, fever or notice severe headache and vision changes, or if symptoms are not better after completion of antibiotic, please schedule an appointment with a health care provider.    Sinus infections are not as easily transmitted as other respiratory infection, however we still recommend that you avoid close contact with loved ones, especially the very young and elderly.  Remember to wash your hands thoroughly throughout the day as this is the number one way to prevent the spread of infection!  Home Care:  Only take medications as instructed by your medical team.  Complete the entire course of an antibiotic.  Do not take these medications with alcohol.  A steam or ultrasonic humidifier can help congestion.  You can place a towel over your head and breathe in the steam from hot water coming from a faucet.  Avoid close contacts especially the very young and the elderly.  Cover your mouth when you cough or sneeze.  Always remember to wash your hands.  Get Help Right Away If:  You develop worsening fever or sinus pain.  You develop a severe head ache or visual changes.  Your symptoms persist after you have completed your treatment plan.  Make sure you  Understand these instructions.  Will watch your condition.  Will get help right away if you are not doing  well or get worse.  Your e-visit answers were reviewed by a board certified advanced clinical practitioner to complete your personal care plan.  Depending on the condition, your plan could have included both over the counter or prescription medications.  If there is a problem please reply  once you have received a response from your provider.  Your safety is important to [place].  If you have drug allergies check your prescription carefully.    You can use MyChart to ask questions about today's visit, request a non-urgent call back, or ask for a work or school excuse for 24 hours related to this e-Visit. If it has been greater than 24 hours you will need to follow up with your provider, or enter a new e-Visit to address those concerns.  You will get an e-mail in the next two days asking about your experience.  I hope that your e-visit has been valuable and will speed your recovery. Thank you for using e-visits.   Approximately 5 minutes was used in reviewing the patient's chart, questionnaire, prescribing medications, and documentation.

## 2020-09-23 DIAGNOSIS — M5416 Radiculopathy, lumbar region: Secondary | ICD-10-CM | POA: Diagnosis not present

## 2020-09-29 ENCOUNTER — Ambulatory Visit: Payer: Medicare Other | Admitting: Emergency Medicine

## 2020-10-11 DIAGNOSIS — M5416 Radiculopathy, lumbar region: Secondary | ICD-10-CM | POA: Diagnosis not present

## 2020-10-13 ENCOUNTER — Ambulatory Visit (INDEPENDENT_AMBULATORY_CARE_PROVIDER_SITE_OTHER): Payer: Medicare Other | Admitting: Emergency Medicine

## 2020-10-13 ENCOUNTER — Other Ambulatory Visit: Payer: Self-pay

## 2020-10-13 ENCOUNTER — Encounter: Payer: Self-pay | Admitting: Emergency Medicine

## 2020-10-13 VITALS — BP 130/80 | HR 44 | Temp 98.0°F | Resp 16 | Ht 65.0 in | Wt 194.0 lb

## 2020-10-13 DIAGNOSIS — G6181 Chronic inflammatory demyelinating polyneuritis: Secondary | ICD-10-CM

## 2020-10-13 DIAGNOSIS — E559 Vitamin D deficiency, unspecified: Secondary | ICD-10-CM | POA: Diagnosis not present

## 2020-10-13 DIAGNOSIS — I10 Essential (primary) hypertension: Secondary | ICD-10-CM

## 2020-10-13 DIAGNOSIS — R609 Edema, unspecified: Secondary | ICD-10-CM | POA: Diagnosis not present

## 2020-10-13 MED ORDER — HYDROCHLOROTHIAZIDE 12.5 MG PO TABS: 12.5000 mg | ORAL_TABLET | Freq: Every day | ORAL | 3 refills | Status: DC | PRN

## 2020-10-13 NOTE — Patient Instructions (Addendum)
   If you have lab work done today you will be contacted with your lab results within the next 2 weeks.  If you have not heard from us then please contact us. The fastest way to get your results is to register for My Chart.   IF you received an x-ray today, you will receive an invoice from [facility]. Please contact [facility] at [phone] with questions or concerns regarding your invoice.   IF you received labwork today, you will receive an invoice from [facility]. Please contact [facility] at [phone] with questions or concerns regarding your invoice.   Our billing staff will not be able to assist you with questions regarding bills from these companies.  You will be contacted with the lab results as soon as they are available. The fastest way to get your results is to activate your My Chart account. Instructions are located on the last page of this paperwork. If you have not heard from us regarding the results in 2 weeks, please contact this office.     Hypertension, Adult High blood pressure (hypertension) is when the force of blood pumping through the arteries is too strong. The arteries are the blood vessels that carry blood from the heart throughout the body. Hypertension forces the heart to work harder to pump blood and may cause arteries to become narrow or stiff. Untreated or uncontrolled hypertension can cause a heart attack, heart failure, a stroke, kidney disease, and other problems. A blood pressure reading consists of a higher number over a lower number. Ideally, your blood pressure should be below 120/80. The first ("top") number is called the systolic pressure. It is a measure of the pressure in your arteries as your heart beats. The second ("bottom") number is called the diastolic pressure. It is a measure of the pressure in your arteries as the heart relaxes. What are the causes? The exact cause of this condition is not known. There are some conditions  that result in or are related to high blood pressure. What increases the risk? Some risk factors for high blood pressure are under your control. The following factors may make you more likely to develop this condition:  Smoking.  Having type 2 diabetes mellitus, high cholesterol, or both.  Not getting enough exercise or physical activity.  Being overweight.  Having too much fat, sugar, calories, or salt (sodium) in your diet.  Drinking too much alcohol. Some risk factors for high blood pressure may be difficult or impossible to change. Some of these factors include:  Having chronic kidney disease.  Having a family history of high blood pressure.  Age. Risk increases with age.  Race. You may be at higher risk if you are African American.  Gender. Men are at higher risk than women before age 45. After age 65, women are at higher risk than men.  Having obstructive sleep apnea.  Stress. What are the signs or symptoms? High blood pressure may not cause symptoms. Very high blood pressure (hypertensive crisis) may cause:  Headache.  Anxiety.  Shortness of breath.  Nosebleed.  Nausea and vomiting.  Vision changes.  Severe chest pain.  Seizures. How is this diagnosed? This condition is diagnosed by measuring your blood pressure while you are seated, with your arm resting on a flat surface, your legs uncrossed, and your feet flat on the floor. The cuff of the blood pressure monitor will be placed directly against the skin of your upper arm at the level of your heart.   It should be measured at least twice using the same arm. Certain conditions can cause a difference in blood pressure between your right and left arms. Certain factors can cause blood pressure readings to be lower or higher than normal for a short period of time:  When your blood pressure is higher when you are in a health care provider's office than when you are at home, this is called white coat hypertension.  Most people with this condition do not need medicines.  When your blood pressure is higher at home than when you are in a health care provider's office, this is called masked hypertension. Most people with this condition may need medicines to control blood pressure. If you have a high blood pressure reading during one visit or you have normal blood pressure with other risk factors, you may be asked to:  Return on a different day to have your blood pressure checked again.  Monitor your blood pressure at home for 1 week or longer. If you are diagnosed with hypertension, you may have other blood or imaging tests to help your health care provider understand your overall risk for other conditions. How is this treated? This condition is treated by making healthy lifestyle changes, such as eating healthy foods, exercising more, and reducing your alcohol intake. Your health care provider may prescribe medicine if lifestyle changes are not enough to get your blood pressure under control, and if:  Your systolic blood pressure is above 130.  Your diastolic blood pressure is above 80. Your personal target blood pressure may vary depending on your medical conditions, your age, and other factors. Follow these instructions at home: Eating and drinking  Eat a diet that is high in fiber and potassium, and low in sodium, added sugar, and fat. An example eating plan is called the DASH (Dietary Approaches to Stop Hypertension) diet. To eat this way: ? Eat plenty of fresh fruits and vegetables. Try to fill one half of your plate at each meal with fruits and vegetables. ? Eat whole grains, such as whole-wheat pasta, brown rice, or whole-grain bread. Fill about one fourth of your plate with whole grains. ? Eat or drink low-fat dairy products, such as skim milk or low-fat yogurt. ? Avoid fatty cuts of meat, processed or cured meats, and poultry with skin. Fill about one fourth of your plate with lean proteins, such  as fish, chicken without skin, beans, eggs, or tofu. ? Avoid pre-made and processed foods. These tend to be higher in sodium, added sugar, and fat.  Reduce your daily sodium intake. Most people with hypertension should eat less than 1,500 mg of sodium a day.  Do not drink alcohol if: ? Your health care provider tells you not to drink. ? You are pregnant, may be pregnant, or are planning to become pregnant.  If you drink alcohol: ? Limit how much you use to:  0-1 drink a day for women.  0-2 drinks a day for men. ? Be aware of how much alcohol is in your drink. In the [place] one drink equals one 12 oz bottle of beer (355 mL), one 5 oz glass of wine (148 mL), or one 1 oz glass of hard liquor (44 mL).   Lifestyle  Work with your health care provider to maintain a healthy body weight or to lose weight. Ask what an ideal weight is for you.  Get at least 30 minutes of exercise most days of the week. Activities may include walking, swimming, or  biking.  Include exercise to strengthen your muscles (resistance exercise), such as Pilates or lifting weights, as part of your weekly exercise routine. Try to do these types of exercises for 30 minutes at least 3 days a week.  Do not use any products that contain nicotine or tobacco, such as cigarettes, e-cigarettes, and chewing tobacco. If you need help quitting, ask your health care provider.  Monitor your blood pressure at home as told by your health care provider.  Keep all follow-up visits as told by your health care provider. This is important.   Medicines  Take over-the-counter and prescription medicines only as told by your health care provider. Follow directions carefully. Blood pressure medicines must be taken as prescribed.  Do not skip doses of blood pressure medicine. Doing this puts you at risk for problems and can make the medicine less effective.  Ask your health care provider about side effects or reactions to medicines that you  should watch for. Contact a health care provider if you:  Think you are having a reaction to a medicine you are taking.  Have headaches that keep coming back (recurring).  Feel dizzy.  Have swelling in your ankles.  Have trouble with your vision. Get help right away if you:  Develop a severe headache or confusion.  Have unusual weakness or numbness.  Feel faint.  Have severe pain in your chest or abdomen.  Vomit repeatedly.  Have trouble breathing. Summary  Hypertension is when the force of blood pumping through your arteries is too strong. If this condition is not controlled, it may put you at risk for serious complications.  Your personal target blood pressure may vary depending on your medical conditions, your age, and other factors. For most people, a normal blood pressure is less than 120/80.  Hypertension is treated with lifestyle changes, medicines, or a combination of both. Lifestyle changes include losing weight, eating a healthy, low-sodium diet, exercising more, and limiting alcohol. This information is not intended to replace advice given to you by your health care provider. Make sure you discuss any questions you have with your health care provider. Document Revised: 04/09/2018 Document Reviewed: 04/09/2018 [employer]vier Patient Education  2021 [employer].
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
## 2020-10-14 ENCOUNTER — Other Ambulatory Visit: Payer: Self-pay | Admitting: Emergency Medicine

## 2020-10-14 DIAGNOSIS — E785 Hyperlipidemia, unspecified: Secondary | ICD-10-CM

## 2020-10-14 LAB — CMP14+EGFR
ALT: 10 IU/L (ref 0–32)
AST: 18 IU/L (ref 0–40)
Albumin/Globulin Ratio: 1.8 (ref 1.2–2.2)
Albumin: 4.5 g/dL (ref 3.7–4.7)
Alkaline Phosphatase: 66 IU/L (ref 44–121)
BUN/Creatinine Ratio: 21 (ref 12–28)
BUN: 23 mg/dL (ref 8–27)
Bilirubin Total: 0.4 mg/dL (ref 0.0–1.2)
CO2: 24 mmol/L (ref 20–29)
Calcium: 9.5 mg/dL (ref 8.7–10.3)
Chloride: 101 mmol/L (ref 96–106)
Creatinine, Ser: 1.07 mg/dL — ABNORMAL HIGH (ref 0.57–1.00)
Globulin, Total: 2.5 g/dL (ref 1.5–4.5)
Glucose: 95 mg/dL (ref 65–99)
Potassium: 3.8 mmol/L (ref 3.5–5.2)
Sodium: 143 mmol/L (ref 134–144)
Total Protein: 7 g/dL (ref 6.0–8.5)
eGFR: 54 mL/min/{1.73_m2} — ABNORMAL LOW (ref >59)

## 2020-10-14 LAB — LIPID PANEL
Chol/HDL Ratio: 2.5 ratio (ref 0.0–4.4)
Cholesterol, Total: 271 mg/dL — ABNORMAL HIGH (ref 100–199)
HDL: 107 mg/dL (ref >39)
LDL Chol Calc (NIH): 145 mg/dL — ABNORMAL HIGH (ref 0–99)
Triglycerides: 113 mg/dL (ref 0–149)
VLDL Cholesterol Cal: 19 mg/dL (ref 5–40)

## 2020-10-14 LAB — VITAMIN D 25 HYDROXY (VIT D DEFICIENCY, FRACTURES): Vit D, 25-Hydroxy: 30.6 ng/mL (ref 30.0–100.0)

## 2020-10-14 MED ORDER — ROSUVASTATIN CALCIUM 10 MG PO TABS: 10.0000 mg | ORAL_TABLET | Freq: Every day | ORAL | 3 refills | Status: DC

## 2020-11-08 ENCOUNTER — Telehealth: Payer: Medicare Other | Admitting: Physician Assistant

## 2020-11-08 DIAGNOSIS — M545 Low back pain, unspecified: Secondary | ICD-10-CM

## 2020-11-08 MED ORDER — TIZANIDINE HCL 2 MG PO CAPS: 2.0000 mg | ORAL_CAPSULE | Freq: Three times a day (TID) | ORAL | 0 refills | Status: DC | PRN

## 2020-11-08 MED ORDER — TIZANIDINE HCL 2 MG PO TABS: 2.0000 mg | ORAL_TABLET | Freq: Three times a day (TID) | ORAL | 0 refills | Status: DC | PRN

## 2020-11-08 NOTE — Progress Notes (Signed)
I have spent 5 minutes in review of e-visit questionnaire, review and updating patient chart, medical decision making and response to patient.   Shauntell Iglesia Cody Avryl Roehm, PA-C    

## 2020-11-08 NOTE — Addendum Note (Signed)
Addended by: Brunetta Jeans on: 11/08/2020 11:16 AM   Modules accepted: Orders

## 2020-11-08 NOTE — Progress Notes (Signed)
We are sorry that you are not feeling well.  Here is how we plan to help!  Based on what you have shared with me it looks like you mostly have acute back pain.  Acute back pain is defined as musculoskeletal pain that can resolve in 1-3 weeks with conservative treatment.  I have prescribed Tizanidine 2 mg every eight hours as needed which is a muscle relaxer Please keep in mind that muscle relaxer's can cause fatigue and should not be taken while at work or driving.  Back pain is very common.  The pain often gets better over time.  The cause of back pain is usually not dangerous.  Most people can learn to manage their back pain on their own.  Home Care  Stay active.  Start with short walks on flat ground if you can.  Try to walk farther each day.  Do not sit, drive or stand in one place for more than 30 minutes.  Do not stay in bed.  Do not avoid exercise or work.  Activity can help your back heal faster.  Be careful when you bend or lift an object.  Bend at your knees, keep the object close to you, and do not twist.  Sleep on a firm mattress.  Lie on your side, and bend your knees.  If you lie on your back, put a pillow under your knees.  Only take medicines as told by your doctor.  Put ice on the injured area.  Put ice in a plastic bag  Place a towel between your skin and the bag  Leave the ice on for 15-20 minutes, 3-4 times a day for the first 2-3 days. 210 After that, you can switch between ice and heat packs.  Ask your doctor about back exercises or massage.  Avoid feeling anxious or stressed.  Find good ways to deal with stress, such as exercise.  Get Help Right Way If:  Your pain does not go away with rest or medicine.  Your pain does not go away in 1 week.  You have new problems.  You do not feel well.  The pain spreads into your legs.  You cannot control when you poop (bowel movement) or pee (urinate)  You feel sick to your stomach (nauseous) or throw up  (vomit)  You have belly (abdominal) pain.  You feel like you may pass out (faint).  If you develop a fever.  Make Sure you:  Understand these instructions.  Will watch your condition  Will get help right away if you are not doing well or get worse.  Your e-visit answers were reviewed by a board certified advanced clinical practitioner to complete your personal care plan.  Depending on the condition, your plan could have included both over the counter or prescription medications.  If there is a problem please reply  once you have received a response from your provider.  Your safety is important to [place].  If you have drug allergies check your prescription carefully.    You can use MyChart to ask questions about today's visit, request a non-urgent call back, or ask for a work or school excuse for 24 hours related to this e-Visit. If it has been greater than 24 hours you will need to follow up with your provider, or enter a new e-Visit to address those concerns.  You will get an e-mail in the next two days asking about your experience.  I hope that your e-visit has been valuable  and will speed your recovery. Thank you for using e-visits.   

## 2020-11-11 DIAGNOSIS — M47816 Spondylosis without myelopathy or radiculopathy, lumbar region: Secondary | ICD-10-CM | POA: Diagnosis not present

## 2020-11-21 ENCOUNTER — Telehealth: Payer: Medicare Other | Admitting: Physician Assistant

## 2020-11-21 DIAGNOSIS — J0140 Acute pansinusitis, unspecified: Secondary | ICD-10-CM | POA: Diagnosis not present

## 2020-11-21 MED ORDER — AZITHROMYCIN 250 MG PO TABS: ORAL_TABLET | ORAL | 0 refills | Status: DC

## 2020-11-21 NOTE — Progress Notes (Signed)
We are sorry that you are not feeling well.  Here is how we plan to help!  Based on what you have shared with me it looks like you have sinusitis.  Sinusitis is inflammation and infection in the sinus cavities of the head.  Based on your presentation I believe you most likely have Acute Bacterial Sinusitis.  This is an infection caused by bacteria and is treated with antibiotics. I have prescribed Azithromycin 250mg  tablets. Take 2 tablets on day 1, then 1 tablet daily until completed. You may use an oral decongestant such as Mucinex D or if you have glaucoma or high blood pressure use plain Mucinex. Saline nasal spray help and can safely be used as often as needed for congestion.  If you develop worsening sinus pain, fever or notice severe headache and vision changes, or if symptoms are not better after completion of antibiotic, please schedule an appointment with a health care provider.    Sinus infections are not as easily transmitted as other respiratory infection, however we still recommend that you avoid close contact with loved ones, especially the very young and elderly.  Remember to wash your hands thoroughly throughout the day as this is the number one way to prevent the spread of infection!  Home Care:  Only take medications as instructed by your medical team.  Complete the entire course of an antibiotic.  Do not take these medications with alcohol.  A steam or ultrasonic humidifier can help congestion.  You can place a towel over your head and breathe in the steam from hot water coming from a faucet.  Avoid close contacts especially the very young and the elderly.  Cover your mouth when you cough or sneeze.  Always remember to wash your hands.  Get Help Right Away If:  You develop worsening fever or sinus pain.  You develop a severe head ache or visual changes.  Your symptoms persist after you have completed your treatment plan.  Make sure you  Understand these  instructions.  Will watch your condition.  Will get help right away if you are not doing well or get worse.  Your e-visit answers were reviewed by a board certified advanced clinical practitioner to complete your personal care plan.  Depending on the condition, your plan could have included both over the counter or prescription medications.  If there is a problem please reply  once you have received a response from your provider.  Your safety is important to [place].  If you have drug allergies check your prescription carefully.    You can use MyChart to ask questions about today's visit, request a non-urgent call back, or ask for a work or school excuse for 24 hours related to this e-Visit. If it has been greater than 24 hours you will need to follow up with your provider, or enter a new e-Visit to address those concerns.  You will get an e-mail in the next two days asking about your experience.  I hope that your e-visit has been valuable and will speed your recovery. Thank you for using e-visits.  I provided 6 minutes of non face-to-face time during this encounter for chart review and documentation.

## 2020-11-22 DIAGNOSIS — M47816 Spondylosis without myelopathy or radiculopathy, lumbar region: Secondary | ICD-10-CM | POA: Diagnosis not present

## 2020-12-01 ENCOUNTER — Telehealth (INDEPENDENT_AMBULATORY_CARE_PROVIDER_SITE_OTHER): Payer: Medicare Other | Admitting: Family Medicine

## 2020-12-01 DIAGNOSIS — R519 Headache, unspecified: Secondary | ICD-10-CM | POA: Diagnosis not present

## 2020-12-01 DIAGNOSIS — R0981 Nasal congestion: Secondary | ICD-10-CM

## 2020-12-01 MED ORDER — DOXYCYCLINE HYCLATE 100 MG PO TABS: 100.0000 mg | ORAL_TABLET | Freq: Two times a day (BID) | ORAL | 0 refills | Status: DC

## 2020-12-01 MED ORDER — BENZONATATE 100 MG PO CAPS: 100.0000 mg | ORAL_CAPSULE | Freq: Three times a day (TID) | ORAL | 0 refills | Status: DC | PRN

## 2020-12-01 NOTE — Patient Instructions (Signed)
-  I sent the medication(s) we discussed to your pharmacy: Meds ordered this encounter  Medications  . benzonatate (TESSALON PERLES) 100 MG capsule    Sig: Take 1 capsule (100 mg total) by mouth 3 (three) times daily as needed.    Dispense:  20 capsule    Refill:  0  . doxycycline (VIBRA-TABS) 100 MG tablet    Sig: Take 1 tablet (100 mg total) by mouth 2 (two) times daily.    Dispense:  20 tablet    Refill:  0   Nasal saline twice daily  Allegra once daily  I hope you are feeling better soon!  Seek in person care promptly if your symptoms worsen, new concerns arise or you are not improving with treatment.  It was nice to meet you today. I help [facility] out with telemedicine visits on Tuesdays and Thursdays and am available for visits on those days. If you have any concerns or questions following this visit please schedule a follow up visit with your Primary Care doctor or seek care at a local urgent care clinic to avoid delays in care.
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
## 2020-12-13 DIAGNOSIS — Z09 Encounter for follow-up examination after completed treatment for conditions other than malignant neoplasm: Secondary | ICD-10-CM | POA: Diagnosis not present

## 2020-12-13 DIAGNOSIS — Z96651 Presence of right artificial knee joint: Secondary | ICD-10-CM | POA: Diagnosis not present

## 2020-12-20 DIAGNOSIS — M47816 Spondylosis without myelopathy or radiculopathy, lumbar region: Secondary | ICD-10-CM | POA: Diagnosis not present

## 2021-01-02 DIAGNOSIS — M47816 Spondylosis without myelopathy or radiculopathy, lumbar region: Secondary | ICD-10-CM | POA: Diagnosis not present

## 2021-01-05 ENCOUNTER — Telehealth: Payer: Medicare Other | Admitting: Emergency Medicine

## 2021-01-05 DIAGNOSIS — M549 Dorsalgia, unspecified: Secondary | ICD-10-CM | POA: Diagnosis not present

## 2021-01-05 MED ORDER — CYCLOBENZAPRINE HCL 10 MG PO TABS: 10.0000 mg | ORAL_TABLET | Freq: Three times a day (TID) | ORAL | 0 refills | Status: DC | PRN

## 2021-01-05 NOTE — Progress Notes (Signed)
We are sorry that you are not feeling well.  Here is how we plan to help!  Based on what you have shared with me it looks like you mostly have acute back pain.  Acute back pain is defined as musculoskeletal pain that can resolve in 1-3 weeks with conservative treatment.  I have prescribed  Flexeril 10 mg every eight hours as needed which is a muscle relaxer. Please keep in mind that muscle relaxer's can cause fatigue and should not be taken while at work or driving.  Back pain is very common.  The pain often gets better over time.  The cause of back pain is usually not dangerous.  Most people can learn to manage their back pain on their own.  Home Care  Stay active.  Start with short walks on flat ground if you can.  Try to walk farther each day.  Do not sit, drive or stand in one place for more than 30 minutes.  Do not stay in bed.  Do not avoid exercise or work.  Activity can help your back heal faster.  Be careful when you bend or lift an object.  Bend at your knees, keep the object close to you, and do not twist.  Sleep on a firm mattress.  Lie on your side, and bend your knees.  If you lie on your back, put a pillow under your knees.  Only take medicines as told by your doctor.  Put ice on the injured area.  Put ice in a plastic bag  Place a towel between your skin and the bag  Leave the ice on for 15-20 minutes, 3-4 times a day for the first 2-3 days. 210 After that, you can switch between ice and heat packs.  Ask your doctor about back exercises or massage.  Avoid feeling anxious or stressed.  Find good ways to deal with stress, such as exercise.  Get Help Right Way If:  Your pain does not go away with rest or medicine.  Your pain does not go away in 1 week.  You have new problems.  You do not feel well.  The pain spreads into your legs.  You cannot control when you poop (bowel movement) or pee (urinate)  You feel sick to your stomach (nauseous) or throw up  (vomit)  You have belly (abdominal) pain.  You feel like you may pass out (faint).  If you develop a fever.  Make Sure you:  Understand these instructions.  Will watch your condition  Will get help right away if you are not doing well or get worse.  Your e-visit answers were reviewed by a board certified advanced clinical practitioner to complete your personal care plan.  Depending on the condition, your plan could have included both over the counter or prescription medications.  If there is a problem please reply  once you have received a response from your provider.  Your safety is important to [place].  If you have drug allergies check your prescription carefully.    You can use MyChart to ask questions about today's visit, request a non-urgent call back, or ask for a work or school excuse for 24 hours related to this e-Visit. If it has been greater than 24 hours you will need to follow up with your provider, or enter a new e-Visit to address those concerns.  You will get an e-mail in the next two days asking about your experience.  I hope that your e-visit has been  valuable and will speed your recovery. Thank you for using e-visits.  Approximately 5 minutes was used in reviewing the patient's chart, questionnaire, prescribing medications, and documentation.

## 2021-01-24 DIAGNOSIS — M47816 Spondylosis without myelopathy or radiculopathy, lumbar region: Secondary | ICD-10-CM | POA: Diagnosis not present

## 2021-02-17 ENCOUNTER — Ambulatory Visit: Payer: Medicare Other

## 2021-02-20 DIAGNOSIS — M47816 Spondylosis without myelopathy or radiculopathy, lumbar region: Secondary | ICD-10-CM | POA: Diagnosis not present

## 2021-02-24 ENCOUNTER — Ambulatory Visit: Payer: Medicare Other

## 2021-03-13 DIAGNOSIS — H02889 Meibomian gland dysfunction of unspecified eye, unspecified eyelid: Secondary | ICD-10-CM | POA: Diagnosis not present

## 2021-03-17 ENCOUNTER — Ambulatory Visit (INDEPENDENT_AMBULATORY_CARE_PROVIDER_SITE_OTHER): Payer: Medicare Other

## 2021-03-17 ENCOUNTER — Other Ambulatory Visit: Payer: Self-pay

## 2021-03-17 VITALS — BP 140/70 | HR 67 | Temp 98.2°F | Ht 65.0 in | Wt 196.0 lb

## 2021-03-17 DIAGNOSIS — Z0000 Encounter for general adult medical examination without abnormal findings: Secondary | ICD-10-CM | POA: Diagnosis not present
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
## 2021-03-17 NOTE — Patient Instructions (Addendum)
Kelli Swanson , Thank you for taking time to come for your Medicare Wellness Visit. I appreciate your ongoing commitment to your health goals. Please review the following plan we discussed and let me know if I can assist you in the future.   Screening recommendations/referrals: Colonoscopy: Cologuard last done 09/07/2019; due every 3 years Mammogram: last done 08/16/2020; due every year Bone Density: last done 06/24/2018; not recommended Recommended yearly ophthalmology/optometry visit for glaucoma screening and checkup Recommended yearly dental visit for hygiene and checkup  Vaccinations: Influenza vaccine: 06/10/2020 Pneumococcal vaccine: 04/27/2013, 10/07/2017 Tdap vaccine: 05/09/2019; due every 10 years Shingles vaccine: Please call your insurance company to determine your out of pocket expense for the Shingrix vaccine. You may receive this vaccine at your local pharmacy.   Covid-19: 09/18/2019, 10/14/2019, 06/05/2020, 12/23/2020  Advanced directives: Please bring a copy of your health care power of attorney and living will to the office at your convenience.  Conditions/risks identified: Yes; Client understands the importance of follow-up with providers by attending scheduled visits and discussed goals to eat healthier, increase physical activity, exercise the brain, socialize more, get enough sleep and make time for laughter.  Next appointment: Please schedule your next Medicare Wellness Visit with your Nurse Health Advisor in 1 year by calling [phone].  Preventive Care 76 Years and Older, Female Preventive care refers to lifestyle choices and visits with your health care provider that can promote health and wellness. What does preventive care include? A yearly physical exam. This is also called an annual well check. Dental exams once or twice a year. Routine eye exams. Ask your health care provider how often you should have your eyes checked. Personal lifestyle choices, including: Daily  care of your teeth and gums. Regular physical activity. Eating a healthy diet. Avoiding tobacco and drug use. Limiting alcohol use. Practicing safe sex. Taking low-dose aspirin every day. Taking vitamin and mineral supplements as recommended by your health care provider. What happens during an annual well check? The services and screenings done by your health care provider during your annual well check will depend on your age, overall health, lifestyle risk factors, and family history of disease. Counseling  Your health care provider may ask you questions about your: Alcohol use. Tobacco use. Drug use. Emotional well-being. Home and relationship well-being. Sexual activity. Eating habits. History of falls. Memory and ability to understand (cognition). Work and work Statistician. Reproductive health. Screening  You may have the following tests or measurements: Height, weight, and BMI. Blood pressure. Lipid and cholesterol levels. These may be checked every 5 years, or more frequently if you are over 76 years old. Skin check. Lung cancer screening. You may have this screening every year starting at age 76 if you have a 30-pack-year history of smoking and currently smoke or have quit within the past 15 years. Fecal occult blood test (FOBT) of the stool. You may have this test every year starting at age 76. Flexible sigmoidoscopy or colonoscopy. You may have a sigmoidoscopy every 5 years or a colonoscopy every 10 years starting at age 76. Hepatitis C blood test. Hepatitis B blood test. Sexually transmitted disease (STD) testing. Diabetes screening. This is done by checking your blood sugar (glucose) after you have not eaten for a while (fasting). You may have this done every 1-3 years. Bone density scan. This is done to screen for osteoporosis. You may have this done starting at age 76. Mammogram. This may be done every 1-2 years. Talk to your health care provider  about how often you  should have regular mammograms. Talk with your health care provider about your test results, treatment options, and if necessary, the need for more tests. Vaccines  Your health care provider may recommend certain vaccines, such as: Influenza vaccine. This is recommended every year. Tetanus, diphtheria, and acellular pertussis (Tdap, Td) vaccine. You may need a Td booster every 10 years. Zoster vaccine. You may need this after age 76. Pneumococcal 13-valent conjugate (PCV13) vaccine. One dose is recommended after age 76. Pneumococcal polysaccharide (PPSV23) vaccine. One dose is recommended after age 76. Talk to your health care provider about which screenings and vaccines you need and how often you need them. This information is not intended to replace advice given to you by your health care provider. Make sure you discuss any questions you have with your health care provider. Document Released: 08/26/2015 Document Revised: 04/18/2016 Document Reviewed: 05/31/2015 Elsevier Interactive Patient Education  2017 [place] Prevention in the Home Falls can cause injuries. They can happen to people of all ages. There are many things you can do to make your home safe and to help prevent falls. What can I do on the outside of my home? Regularly fix the edges of walkways and driveways and fix any cracks. Remove anything that might make you trip as you walk through a door, such as a raised step or threshold. Trim any bushes or trees on the path to your home. Use bright outdoor lighting. Clear any walking paths of anything that might make someone trip, such as rocks or tools. Regularly check to see if handrails are loose or broken. Make sure that both sides of any steps have handrails. Any raised decks and porches should have guardrails on the edges. Have any leaves, snow, or ice cleared regularly. Use sand or salt on walking paths during winter. Clean up any spills in your garage right away.  This includes oil or grease spills. What can I do in the bathroom? Use night lights. Install grab bars by the toilet and in the tub and shower. Do not use towel bars as grab bars. Use non-skid mats or decals in the tub or shower. If you need to sit down in the shower, use a plastic, non-slip stool. Keep the floor dry. Clean up any water that spills on the floor as soon as it happens. Remove soap buildup in the tub or shower regularly. Attach bath mats securely with double-sided non-slip rug tape. Do not have throw rugs and other things on the floor that can make you trip. What can I do in the bedroom? Use night lights. Make sure that you have a light by your bed that is easy to reach. Do not use any sheets or blankets that are too big for your bed. They should not hang down onto the floor. Have a firm chair that has side arms. You can use this for support while you get dressed. Do not have throw rugs and other things on the floor that can make you trip. What can I do in the kitchen? Clean up any spills right away. Avoid walking on wet floors. Keep items that you use a lot in easy-to-reach places. If you need to reach something above you, use a strong step stool that has a grab bar. Keep electrical cords out of the way. Do not use floor polish or wax that makes floors slippery. If you must use wax, use non-skid floor wax. Do not have throw rugs and  other things on the floor that can make you trip. What can I do with my stairs? Do not leave any items on the stairs. Make sure that there are handrails on both sides of the stairs and use them. Fix handrails that are broken or loose. Make sure that handrails are as long as the stairways. Check any carpeting to make sure that it is firmly attached to the stairs. Fix any carpet that is loose or worn. Avoid having throw rugs at the top or bottom of the stairs. If you do have throw rugs, attach them to the floor with carpet tape. Make sure that  you have a light switch at the top of the stairs and the bottom of the stairs. If you do not have them, ask someone to add them for you. What else can I do to help prevent falls? Wear shoes that: Do not have high heels. Have rubber bottoms. Are comfortable and fit you well. Are closed at the toe. Do not wear sandals. If you use a stepladder: Make sure that it is fully opened. Do not climb a closed stepladder. Make sure that both sides of the stepladder are locked into place. Ask someone to hold it for you, if possible. Clearly mark and make sure that you can see: Any grab bars or handrails. First and last steps. Where the edge of each step is. Use tools that help you move around (mobility aids) if they are needed. These include: Canes. Walkers. Scooters. Crutches. Turn on the lights when you go into a dark area. Replace any light bulbs as soon as they burn out. Set up your furniture so you have a clear path. Avoid moving your furniture around. If any of your floors are uneven, fix them. If there are any pets around you, be aware of where they are. Review your medicines with your doctor. Some medicines can make you feel dizzy. This can increase your chance of falling. Ask your doctor what other things that you can do to help prevent falls. This information is not intended to replace advice given to you by your health care provider. Make sure you discuss any questions you have with your health care provider. Document Released: 05/26/2009 Document Revised: 01/05/2016 Document Reviewed: 09/03/2014 Elsevier Interactive Patient Education  2017 [employer].

## 2021-03-28 DIAGNOSIS — D2310 Other benign neoplasm of skin of unspecified eyelid, including canthus: Secondary | ICD-10-CM | POA: Diagnosis not present

## 2021-03-29 DIAGNOSIS — D23122 Other benign neoplasm of skin of left lower eyelid, including canthus: Secondary | ICD-10-CM | POA: Diagnosis not present

## 2021-04-07 DIAGNOSIS — M810 Age-related osteoporosis without current pathological fracture: Secondary | ICD-10-CM | POA: Diagnosis not present

## 2021-04-07 DIAGNOSIS — M85852 Other specified disorders of bone density and structure, left thigh: Secondary | ICD-10-CM | POA: Diagnosis not present

## 2021-04-07 LAB — HM MAMMOGRAPHY

## 2021-04-14 DIAGNOSIS — D2310 Other benign neoplasm of skin of unspecified eyelid, including canthus: Secondary | ICD-10-CM | POA: Diagnosis not present

## 2021-04-27 ENCOUNTER — Encounter: Payer: Self-pay | Admitting: Emergency Medicine

## 2021-04-27 DIAGNOSIS — M810 Age-related osteoporosis without current pathological fracture: Secondary | ICD-10-CM | POA: Diagnosis not present

## 2021-05-02 ENCOUNTER — Other Ambulatory Visit: Payer: Self-pay | Admitting: Emergency Medicine

## 2021-05-15 ENCOUNTER — Ambulatory Visit: Payer: Medicare Other | Admitting: Emergency Medicine

## 2021-05-23 ENCOUNTER — Encounter: Payer: Self-pay | Admitting: Emergency Medicine

## 2021-05-23 ENCOUNTER — Other Ambulatory Visit: Payer: Self-pay

## 2021-05-23 ENCOUNTER — Ambulatory Visit (INDEPENDENT_AMBULATORY_CARE_PROVIDER_SITE_OTHER): Payer: Medicare Other | Admitting: Emergency Medicine

## 2021-05-23 VITALS — BP 130/70 | HR 65 | Temp 98.2°F | Ht 65.0 in | Wt 194.0 lb

## 2021-05-23 DIAGNOSIS — M810 Age-related osteoporosis without current pathological fracture: Secondary | ICD-10-CM

## 2021-05-23 DIAGNOSIS — E782 Mixed hyperlipidemia: Secondary | ICD-10-CM

## 2021-05-23 DIAGNOSIS — I10 Essential (primary) hypertension: Secondary | ICD-10-CM | POA: Diagnosis not present

## 2021-05-23 DIAGNOSIS — E785 Hyperlipidemia, unspecified: Secondary | ICD-10-CM | POA: Diagnosis not present

## 2021-05-23 NOTE — Assessment & Plan Note (Signed)
Well-controlled hypertension with normal blood pressure readings at home.  Continue hydrochlorothiazide 12.5 mg daily. Dietary approaches to stop hypertension discussed. Role of stress and hypertension discussed. Follow-up in 6 months.
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
## 2021-05-23 NOTE — Patient Instructions (Signed)

## 2021-05-23 NOTE — Assessment & Plan Note (Signed)
Diet and nutrition discussed.  Had trouble adjusting to rosuvastatin side effects but overcame them now is taking it daily at bedtime 10 mg.

## 2021-05-24 LAB — LIPID PANEL
Cholesterol: 167 mg/dL (ref 0–200)
HDL: 89 mg/dL (ref >39.00)
LDL Cholesterol: 64 mg/dL (ref 0–99)
NonHDL: 77.67
Total CHOL/HDL Ratio: 2
Triglycerides: 70 mg/dL (ref 0.0–149.0)
VLDL: 14 mg/dL (ref 0.0–40.0)

## 2021-05-24 LAB — COMPREHENSIVE METABOLIC PANEL
ALT: 10 U/L (ref 0–35)
AST: 20 U/L (ref 0–37)
Albumin: 4.2 g/dL (ref 3.5–5.2)
Alkaline Phosphatase: 81 U/L (ref 39–117)
BUN: 15 mg/dL (ref 6–23)
CO2: 28 mEq/L (ref 19–32)
Calcium: 9.8 mg/dL (ref 8.4–10.5)
Chloride: 101 mEq/L (ref 96–112)
Creatinine, Ser: 0.9 mg/dL (ref 0.40–1.20)
GFR: 62.07 mL/min (ref >60.00)
Glucose, Bld: 94 mg/dL (ref 70–99)
Potassium: 4.4 mEq/L (ref 3.5–5.1)
Sodium: 140 mEq/L (ref 135–145)
Total Bilirubin: 0.5 mg/dL (ref 0.2–1.2)
Total Protein: 7.4 g/dL (ref 6.0–8.3)

## 2021-05-24 LAB — VITAMIN D 25 HYDROXY (VIT D DEFICIENCY, FRACTURES): VITD: 42.24 ng/mL (ref 30.00–100.00)

## 2021-05-24 LAB — HEMOGLOBIN A1C: Hgb A1c MFr Bld: 5.7 % (ref 4.6–6.5)

## 2021-06-07 ENCOUNTER — Telehealth: Payer: Self-pay | Admitting: Emergency Medicine

## 2021-06-07 NOTE — Telephone Encounter (Signed)
Patient calling to request results of labs for 05-23-2021  Patient requesting results emailed to [email or web address]  Patient also states she previously requested lab results be sent to [facility]

## 2021-06-08 NOTE — Telephone Encounter (Signed)
Patient calling back regarding labs being sent to her via email: [email or web address]  Also would like results sent to Dr. Wandra Feinstein:  [facility] [address] [place]. [place], [place] [address] [phone]

## 2021-06-09 NOTE — Telephone Encounter (Signed)
Faxed lab results to [facility] and emailed a copy to pt.

## 2021-06-29 DIAGNOSIS — M25561 Pain in right knee: Secondary | ICD-10-CM | POA: Diagnosis not present

## 2021-07-11 ENCOUNTER — Other Ambulatory Visit: Payer: Self-pay | Admitting: Emergency Medicine

## 2021-07-11 DIAGNOSIS — F5101 Primary insomnia: Secondary | ICD-10-CM

## 2021-08-29 DIAGNOSIS — Z1231 Encounter for screening mammogram for malignant neoplasm of breast: Secondary | ICD-10-CM | POA: Diagnosis not present

## 2021-08-29 LAB — HM MAMMOGRAPHY

## 2021-09-18 ENCOUNTER — Encounter: Payer: Self-pay | Admitting: Emergency Medicine

## 2021-09-18 NOTE — Progress Notes (Signed)
Documented mammogram results.

## 2021-09-22 ENCOUNTER — Encounter: Payer: Self-pay | Admitting: Emergency Medicine

## 2021-09-25 ENCOUNTER — Encounter: Payer: Self-pay | Admitting: Emergency Medicine

## 2021-10-05 ENCOUNTER — Other Ambulatory Visit: Payer: Self-pay | Admitting: Emergency Medicine

## 2021-10-05 DIAGNOSIS — E785 Hyperlipidemia, unspecified: Secondary | ICD-10-CM

## 2021-10-30 ENCOUNTER — Telehealth: Payer: Medicare Other | Admitting: Nurse Practitioner

## 2021-10-30 DIAGNOSIS — J0100 Acute maxillary sinusitis, unspecified: Secondary | ICD-10-CM

## 2021-10-30 MED ORDER — DOXYCYCLINE HYCLATE 100 MG PO TABS: 100.0000 mg | ORAL_TABLET | Freq: Two times a day (BID) | ORAL | 0 refills | Status: DC

## 2021-10-30 NOTE — Progress Notes (Signed)

## 2021-11-07 DIAGNOSIS — M47816 Spondylosis without myelopathy or radiculopathy, lumbar region: Secondary | ICD-10-CM | POA: Diagnosis not present

## 2021-11-07 DIAGNOSIS — M533 Sacrococcygeal disorders, not elsewhere classified: Secondary | ICD-10-CM | POA: Diagnosis not present

## 2021-11-21 DIAGNOSIS — M533 Sacrococcygeal disorders, not elsewhere classified: Secondary | ICD-10-CM | POA: Diagnosis not present

## 2021-11-26 ENCOUNTER — Telehealth: Payer: Medicare Other | Admitting: Physician Assistant

## 2021-11-26 DIAGNOSIS — M5441 Lumbago with sciatica, right side: Secondary | ICD-10-CM | POA: Diagnosis not present

## 2021-11-26 MED ORDER — CYCLOBENZAPRINE HCL 10 MG PO TABS
5.0000 mg | ORAL_TABLET | Freq: Three times a day (TID) | ORAL | 0 refills | Status: DC | PRN
Start: 2021-11-26 — End: 2022-03-02

## 2021-11-26 NOTE — Progress Notes (Signed)

## 2021-12-05 DIAGNOSIS — M545 Low back pain, unspecified: Secondary | ICD-10-CM | POA: Diagnosis not present

## 2021-12-05 DIAGNOSIS — M25561 Pain in right knee: Secondary | ICD-10-CM | POA: Diagnosis not present

## 2021-12-10 ENCOUNTER — Other Ambulatory Visit: Payer: Self-pay | Admitting: Emergency Medicine

## 2021-12-10 DIAGNOSIS — R609 Edema, unspecified: Secondary | ICD-10-CM

## 2021-12-12 DIAGNOSIS — M533 Sacrococcygeal disorders, not elsewhere classified: Secondary | ICD-10-CM | POA: Diagnosis not present

## 2022-01-02 DIAGNOSIS — M533 Sacrococcygeal disorders, not elsewhere classified: Secondary | ICD-10-CM | POA: Diagnosis not present

## 2022-01-04 ENCOUNTER — Other Ambulatory Visit: Payer: Self-pay | Admitting: Emergency Medicine

## 2022-01-04 DIAGNOSIS — R609 Edema, unspecified: Secondary | ICD-10-CM

## 2022-02-28 ENCOUNTER — Ambulatory Visit (INDEPENDENT_AMBULATORY_CARE_PROVIDER_SITE_OTHER): Payer: Medicare Other | Admitting: Emergency Medicine

## 2022-02-28 ENCOUNTER — Encounter: Payer: Self-pay | Admitting: Emergency Medicine

## 2022-02-28 VITALS — BP 122/68 | HR 77 | Temp 97.9°F | Ht 65.0 in | Wt 204.0 lb

## 2022-02-28 DIAGNOSIS — R21 Rash and other nonspecific skin eruption: Secondary | ICD-10-CM | POA: Diagnosis not present

## 2022-02-28 MED ORDER — TRIAMCINOLONE ACETONIDE 0.1 % EX CREA: 1.0000 | TOPICAL_CREAM | Freq: Two times a day (BID) | CUTANEOUS | 1 refills | Status: AC
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
## 2022-02-28 NOTE — Patient Instructions (Signed)
Health Maintenance After Age 77 After age 77, you are at a higher risk for certain long-term diseases and infections as well as injuries from falls. Falls are a major cause of broken bones and head injuries in people who are older than age 77. Getting regular preventive care can help to keep you healthy and well. Preventive care includes getting regular testing and making lifestyle changes as recommended by your health care provider. Talk with your health care provider about: Which screenings and tests you should have. A screening is a test that checks for a disease when you have no symptoms. A diet and exercise plan that is right for you. What should I know about screenings and tests to prevent falls? Screening and testing are the best ways to find a health problem early. Early diagnosis and treatment give you the best chance of managing medical conditions that are common after age 77. Certain conditions and lifestyle choices may make you more likely to have a fall. Your health care provider may recommend: Regular vision checks. Poor vision and conditions such as cataracts can make you more likely to have a fall. If you wear glasses, make sure to get your prescription updated if your vision changes. Medicine review. Work with your health care provider to regularly review all of the medicines you are taking, including over-the-counter medicines. Ask your health care provider about any side effects that may make you more likely to have a fall. Tell your health care provider if any medicines that you take make you feel dizzy or sleepy. Strength and balance checks. Your health care provider may recommend certain tests to check your strength and balance while standing, walking, or changing positions. Foot health exam. Foot pain and numbness, as well as not wearing proper footwear, can make you more likely to have a fall. Screenings, including: Osteoporosis screening. Osteoporosis is a condition that causes  the bones to get weaker and break more easily. Blood pressure screening. Blood pressure changes and medicines to control blood pressure can make you feel dizzy. Depression screening. You may be more likely to have a fall if you have a fear of falling, feel depressed, or feel unable to do activities that you used to do. Alcohol use screening. Using too much alcohol can affect your balance and may make you more likely to have a fall. Follow these instructions at home: Lifestyle Do not drink alcohol if: Your health care provider tells you not to drink. If you drink alcohol: Limit how much you have to: 0-1 drink a day for women. 0-2 drinks a day for men. Know how much alcohol is in your drink. In the [place] one drink equals one 12 oz bottle of beer (355 mL), one 5 oz glass of wine (148 mL), or one 1 oz glass of hard liquor (44 mL). Do not use any products that contain nicotine or tobacco. These products include cigarettes, chewing tobacco, and vaping devices, such as e-cigarettes. If you need help quitting, ask your health care provider. Activity  Follow a regular exercise program to stay fit. This will help you maintain your balance. Ask your health care provider what types of exercise are appropriate for you. If you need a cane or walker, use it as recommended by your health care provider. Wear supportive shoes that have nonskid soles. Safety  Remove any tripping hazards, such as rugs, cords, and clutter. Install safety equipment such as grab bars in bathrooms and safety rails on stairs. Keep rooms and walkways   well-lit. General instructions Talk with your health care provider about your risks for falling. Tell your health care provider if: You fall. Be sure to tell your health care provider about all falls, even ones that seem minor. You feel dizzy, tiredness (fatigue), or off-balance. Take over-the-counter and prescription medicines only as told by your health care provider. These include  supplements. Eat a healthy diet and maintain a healthy weight. A healthy diet includes low-fat dairy products, low-fat (lean) meats, and fiber from whole grains, beans, and lots of fruits and vegetables. Stay current with your vaccines. Schedule regular health, dental, and eye exams. Summary Having a healthy lifestyle and getting preventive care can help to protect your health and wellness after age 77. Screening and testing are the best way to find a health problem early and help you avoid having a fall. Early diagnosis and treatment give you the best chance for managing medical conditions that are more common for people who are older than age 77. Falls are a major cause of broken bones and head injuries in people who are older than age 77. Take precautions to prevent a fall at home. Work with your health care provider to learn what changes you can make to improve your health and wellness and to prevent falls. This information is not intended to replace advice given to you by your health care provider. Make sure you discuss any questions you have with your health care provider. Document Revised: 12/19/2020 Document Reviewed: 12/19/2020 Elsevier Patient Education  2023 Elsevier Inc.  

## 2022-02-28 NOTE — Assessment & Plan Note (Signed)
No malignant features. Unresponsive to topical antibiotic cream. May benefit from triamcinolone cream. Follow-up with dermatologist as scheduled.

## 2022-03-02 ENCOUNTER — Telehealth: Payer: Medicare Other | Admitting: Physician Assistant

## 2022-03-02 DIAGNOSIS — M5442 Lumbago with sciatica, left side: Secondary | ICD-10-CM | POA: Diagnosis not present

## 2022-03-02 DIAGNOSIS — G8929 Other chronic pain: Secondary | ICD-10-CM | POA: Diagnosis not present

## 2022-03-02 MED ORDER — CYCLOBENZAPRINE HCL 10 MG PO TABS: 5.0000 mg | ORAL_TABLET | Freq: Three times a day (TID) | ORAL | 0 refills | Status: AC | PRN

## 2022-03-02 NOTE — Progress Notes (Signed)
We are sorry that you are not feeling well.  Here is how we plan to help!  Based on what you have shared with me it looks like you mostly have acute back pain.  Acute back pain is defined as musculoskeletal pain that can resolve in 1-3 weeks with conservative treatment.  I have prescribed Flexeril 10 mg every eight hours as needed which is a muscle relaxer  Some patients experience stomach irritation or in increased heartburn with anti-inflammatory drugs.  Please keep in mind that muscle relaxer's can cause fatigue and should not be taken while at work or driving.  Back pain is very common.  The pain often gets better over time.  The cause of back pain is usually not dangerous.  Most people can learn to manage their back pain on their own.  Home Care  Stay active.  Start with short walks on flat ground if you can.  Try to walk farther each day.  Do not sit, drive or stand in one place for more than 30 minutes.  Do not stay in bed.  Do not avoid exercise or work.  Activity can help your back heal faster.  Be careful when you bend or lift an object.  Bend at your knees, keep the object close to you, and do not twist.  Sleep on a firm mattress.  Lie on your side, and bend your knees.  If you lie on your back, put a pillow under your knees.  Only take medicines as told by your doctor.  Put ice on the injured area.  Put ice in a plastic bag  Place a towel between your skin and the bag  Leave the ice on for 15-20 minutes, 3-4 times a day for the first 2-3 days. 210 After that, you can switch between ice and heat packs.  Ask your doctor about back exercises or massage.  Avoid feeling anxious or stressed.  Find good ways to deal with stress, such as exercise.  Get Help Right Way If:  Your pain does not go away with rest or medicine.  Your pain does not go away in 1 week.  You have new problems.  You do not feel well.  The pain spreads into your legs.  You cannot control when you  poop (bowel movement) or pee (urinate)  You feel sick to your stomach (nauseous) or throw up (vomit)  You have belly (abdominal) pain.  You feel like you may pass out (faint).  If you develop a fever.  Make Sure you:  Understand these instructions.  Will watch your condition  Will get help right away if you are not doing well or get worse.  Your e-visit answers were reviewed by a board certified advanced clinical practitioner to complete your personal care plan.  Depending on the condition, your plan could have included both over the counter or prescription medications.  If there is a problem please reply  once you have received a response from your provider.  Your safety is important to us.  If you have drug allergies check your prescription carefully.    You can use MyChart to ask questions about today's visit, request a non-urgent call back, or ask for a work or school excuse for 24 hours related to this e-Visit. If it has been greater than 24 hours you will need to follow up with your provider, or enter a new e-Visit to address those concerns.  You will get an e-mail in the next two   I provided 5 minutes of non face-to-face time during this encounter for chart review and documentation.

## 2022-03-20 ENCOUNTER — Ambulatory Visit: Payer: Medicare Other

## 2022-03-26 ENCOUNTER — Ambulatory Visit (INDEPENDENT_AMBULATORY_CARE_PROVIDER_SITE_OTHER): Payer: Medicare Other

## 2022-03-26 DIAGNOSIS — Z0000 Encounter for general adult medical examination without abnormal findings: Secondary | ICD-10-CM | POA: Diagnosis not present
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
## 2022-03-26 NOTE — Patient Instructions (Signed)
Kelli Swanson , Thank you for taking time to come for your Medicare Wellness Visit. I appreciate your ongoing commitment to your health goals. Please review the following plan we discussed and let me know if I can assist you in the future.   Screening recommendations/referrals: Colonoscopy: No longer recommended due to age. Mammogram: 08/29/2021; due every year Bone Density: 04/07/2021; due every 2 years Recommended yearly ophthalmology/optometry visit for glaucoma screening and checkup Recommended yearly dental visit for hygiene and checkup  Vaccinations:  Influenza vaccine: 05/18/2021 Pneumococcal vaccine: 04/27/2013, 10/07/2017 Tdap vaccine: 05/09/2019 Shingles vaccine: 02/02/2022; will get second dose at local pharmacy   Covid-19: 09/18/2019, 10/14/2019, 06/05/2020, 12/23/2020, 05/18/2021  Advanced directives: Yes; Please bring a copy of your health care power of attorney and living will to the office at your convenience.  Conditions/risks identified: Yes  Next appointment: Please schedule your next Medicare Wellness Visit with your Nurse Health Advisor in 1 year by calling [phone].   Preventive Care 26 Years and Older, Female Preventive care refers to lifestyle choices and visits with your health care provider that can promote health and wellness. What does preventive care include? A yearly physical exam. This is also called an annual well check. Dental exams once or twice a year. Routine eye exams. Ask your health care provider how often you should have your eyes checked. Personal lifestyle choices, including: Daily care of your teeth and gums. Regular physical activity. Eating a healthy diet. Avoiding tobacco and drug use. Limiting alcohol use. Practicing safe sex. Taking low-dose aspirin every day. Taking vitamin and mineral supplements as recommended by your health care provider. What happens during an annual well check? The services and screenings done by your health care  provider during your annual well check will depend on your age, overall health, lifestyle risk factors, and family history of disease. Counseling  Your health care provider may ask you questions about your: Alcohol use. Tobacco use. Drug use. Emotional well-being. Home and relationship well-being. Sexual activity. Eating habits. History of falls. Memory and ability to understand (cognition). Work and work Statistician. Reproductive health. Screening  You may have the following tests or measurements: Height, weight, and BMI. Blood pressure. Lipid and cholesterol levels. These may be checked every 5 years, or more frequently if you are over 60 years old. Skin check. Lung cancer screening. You may have this screening every year starting at age 61 if you have a 30-pack-year history of smoking and currently smoke or have quit within the past 15 years. Fecal occult blood test (FOBT) of the stool. You may have this test every year starting at age 11. Flexible sigmoidoscopy or colonoscopy. You may have a sigmoidoscopy every 5 years or a colonoscopy every 10 years starting at age 101. Hepatitis C blood test. Hepatitis B blood test. Sexually transmitted disease (STD) testing. Diabetes screening. This is done by checking your blood sugar (glucose) after you have not eaten for a while (fasting). You may have this done every 1-3 years. Bone density scan. This is done to screen for osteoporosis. You may have this done starting at age 19. Mammogram. This may be done every 1-2 years. Talk to your health care provider about how often you should have regular mammograms. Talk with your health care provider about your test results, treatment options, and if necessary, the need for more tests. Vaccines  Your health care provider may recommend certain vaccines, such as: Influenza vaccine. This is recommended every year. Tetanus, diphtheria, and acellular pertussis (Tdap, Td) vaccine.  You may need a Td  booster every 10 years. Zoster vaccine. You may need this after age 25. Pneumococcal 13-valent conjugate (PCV13) vaccine. One dose is recommended after age 48. Pneumococcal polysaccharide (PPSV23) vaccine. One dose is recommended after age 60. Talk to your health care provider about which screenings and vaccines you need and how often you need them. This information is not intended to replace advice given to you by your health care provider. Make sure you discuss any questions you have with your health care provider. Document Released: 08/26/2015 Document Revised: 04/18/2016 Document Reviewed: 05/31/2015 Elsevier Interactive Patient Education  2017 [employer] Prevention in the Home Falls can cause injuries. They can happen to people of all ages. There are many things you can do to make your home safe and to help prevent falls. What can I do on the outside of my home? Regularly fix the edges of walkways and driveways and fix any cracks. Remove anything that might make you trip as you walk through a door, such as a raised step or threshold. Trim any bushes or trees on the path to your home. Use bright outdoor lighting. Clear any walking paths of anything that might make someone trip, such as rocks or tools. Regularly check to see if handrails are loose or broken. Make sure that both sides of any steps have handrails. Any raised decks and porches should have guardrails on the edges. Have any leaves, snow, or ice cleared regularly. Use sand or salt on walking paths during winter. Clean up any spills in your garage right away. This includes oil or grease spills. What can I do in the bathroom? Use night lights. Install grab bars by the toilet and in the tub and shower. Do not use towel bars as grab bars. Use non-skid mats or decals in the tub or shower. If you need to sit down in the shower, use a plastic, non-slip stool. Keep the floor dry. Clean up any water that spills on the floor  as soon as it happens. Remove soap buildup in the tub or shower regularly. Attach bath mats securely with double-sided non-slip rug tape. Do not have throw rugs and other things on the floor that can make you trip. What can I do in the bedroom? Use night lights. Make sure that you have a light by your bed that is easy to reach. Do not use any sheets or blankets that are too big for your bed. They should not hang down onto the floor. Have a firm chair that has side arms. You can use this for support while you get dressed. Do not have throw rugs and other things on the floor that can make you trip. What can I do in the kitchen? Clean up any spills right away. Avoid walking on wet floors. Keep items that you use a lot in easy-to-reach places. If you need to reach something above you, use a strong step stool that has a grab bar. Keep electrical cords out of the way. Do not use floor polish or wax that makes floors slippery. If you must use wax, use non-skid floor wax. Do not have throw rugs and other things on the floor that can make you trip. What can I do with my stairs? Do not leave any items on the stairs. Make sure that there are handrails on both sides of the stairs and use them. Fix handrails that are broken or loose. Make sure that handrails are as long as  the stairways. Check any carpeting to make sure that it is firmly attached to the stairs. Fix any carpet that is loose or worn. Avoid having throw rugs at the top or bottom of the stairs. If you do have throw rugs, attach them to the floor with carpet tape. Make sure that you have a light switch at the top of the stairs and the bottom of the stairs. If you do not have them, ask someone to add them for you. What else can I do to help prevent falls? Wear shoes that: Do not have high heels. Have rubber bottoms. Are comfortable and fit you well. Are closed at the toe. Do not wear sandals. If you use a stepladder: Make sure that it is  fully opened. Do not climb a closed stepladder. Make sure that both sides of the stepladder are locked into place. Ask someone to hold it for you, if possible. Clearly mark and make sure that you can see: Any grab bars or handrails. First and last steps. Where the edge of each step is. Use tools that help you move around (mobility aids) if they are needed. These include: Canes. Walkers. Scooters. Crutches. Turn on the lights when you go into a dark area. Replace any light bulbs as soon as they burn out. Set up your furniture so you have a clear path. Avoid moving your furniture around. If any of your floors are uneven, fix them. If there are any pets around you, be aware of where they are. Review your medicines with your doctor. Some medicines can make you feel dizzy. This can increase your chance of falling. Ask your doctor what other things that you can do to help prevent falls. This information is not intended to replace advice given to you by your health care provider. Make sure you discuss any questions you have with your health care provider. Document Released: 05/26/2009 Document Revised: 01/05/2016 Document Reviewed: 09/03/2014 Elsevier Interactive Patient Education  2017 [employer].

## 2022-05-23 DIAGNOSIS — L570 Actinic keratosis: Secondary | ICD-10-CM | POA: Diagnosis not present

## 2022-05-23 DIAGNOSIS — C44319 Basal cell carcinoma of skin of other parts of face: Secondary | ICD-10-CM | POA: Diagnosis not present

## 2022-05-23 DIAGNOSIS — D485 Neoplasm of uncertain behavior of skin: Secondary | ICD-10-CM | POA: Diagnosis not present

## 2022-06-08 ENCOUNTER — Other Ambulatory Visit: Payer: Self-pay | Admitting: Emergency Medicine

## 2022-06-08 DIAGNOSIS — R609 Edema, unspecified: Secondary | ICD-10-CM

## 2022-06-08 DIAGNOSIS — F5101 Primary insomnia: Secondary | ICD-10-CM

## 2022-06-12 DIAGNOSIS — M25561 Pain in right knee: Secondary | ICD-10-CM | POA: Diagnosis not present

## 2022-06-12 DIAGNOSIS — M545 Low back pain, unspecified: Secondary | ICD-10-CM | POA: Diagnosis not present

## 2022-06-12 DIAGNOSIS — M810 Age-related osteoporosis without current pathological fracture: Secondary | ICD-10-CM | POA: Diagnosis not present

## 2022-06-12 DIAGNOSIS — R600 Localized edema: Secondary | ICD-10-CM | POA: Diagnosis not present

## 2022-06-21 DIAGNOSIS — M810 Age-related osteoporosis without current pathological fracture: Secondary | ICD-10-CM | POA: Diagnosis not present

## 2022-06-28 DIAGNOSIS — C44319 Basal cell carcinoma of skin of other parts of face: Secondary | ICD-10-CM | POA: Diagnosis not present

## 2022-07-03 ENCOUNTER — Other Ambulatory Visit: Payer: Self-pay | Admitting: Emergency Medicine

## 2022-07-03 DIAGNOSIS — F5101 Primary insomnia: Secondary | ICD-10-CM

## 2022-07-08 ENCOUNTER — Other Ambulatory Visit: Payer: Self-pay | Admitting: Emergency Medicine

## 2022-07-08 DIAGNOSIS — R609 Edema, unspecified: Secondary | ICD-10-CM

## 2022-07-10 DIAGNOSIS — M545 Low back pain, unspecified: Secondary | ICD-10-CM | POA: Diagnosis not present

## 2022-07-10 DIAGNOSIS — Z79899 Other long term (current) drug therapy: Secondary | ICD-10-CM | POA: Diagnosis not present

## 2022-07-10 DIAGNOSIS — Z5181 Encounter for therapeutic drug level monitoring: Secondary | ICD-10-CM | POA: Diagnosis not present

## 2022-07-31 DIAGNOSIS — M542 Cervicalgia: Secondary | ICD-10-CM | POA: Diagnosis not present

## 2022-07-31 DIAGNOSIS — M545 Low back pain, unspecified: Secondary | ICD-10-CM | POA: Diagnosis not present

## 2022-08-10 DIAGNOSIS — M810 Age-related osteoporosis without current pathological fracture: Secondary | ICD-10-CM | POA: Diagnosis not present

## 2023-01-16 ENCOUNTER — Other Ambulatory Visit: Payer: Self-pay | Admitting: Emergency Medicine

## 2023-01-16 DIAGNOSIS — R600 Localized edema: Secondary | ICD-10-CM

## 2023-08-06 ENCOUNTER — Other Ambulatory Visit: Payer: Self-pay | Admitting: Emergency Medicine

## 2023-08-06 DIAGNOSIS — R600 Localized edema: Secondary | ICD-10-CM
